# Patient Record
Sex: Male | Born: 1955 | Race: White | Hispanic: No | Marital: Married | Smoking: Never smoker
Health system: Southern US, Community
[De-identification: ages and names within clinical notes are randomized; demographics above are authoritative.]

## PROBLEM LIST (undated history)

## (undated) DIAGNOSIS — I10 Essential (primary) hypertension: Secondary | ICD-10-CM

## (undated) DIAGNOSIS — N400 Enlarged prostate without lower urinary tract symptoms: Secondary | ICD-10-CM

## (undated) DIAGNOSIS — Z87442 Personal history of urinary calculi: Secondary | ICD-10-CM

## (undated) DIAGNOSIS — K219 Gastro-esophageal reflux disease without esophagitis: Secondary | ICD-10-CM

## (undated) DIAGNOSIS — E119 Type 2 diabetes mellitus without complications: Secondary | ICD-10-CM

## (undated) HISTORY — PX: HERNIA REPAIR: SHX51

## (undated) HISTORY — PX: TONSILLECTOMY: SUR1361

## (undated) HISTORY — PX: CHOLECYSTECTOMY: SHX55

---

## 2006-04-12 ENCOUNTER — Ambulatory Visit: Payer: Self-pay | Admitting: Internal Medicine

## 2006-04-19 ENCOUNTER — Ambulatory Visit: Payer: Self-pay | Admitting: Urology

## 2006-04-22 ENCOUNTER — Ambulatory Visit: Payer: Self-pay | Admitting: Urology

## 2006-04-29 ENCOUNTER — Ambulatory Visit: Payer: Self-pay | Admitting: Urology

## 2006-05-31 ENCOUNTER — Ambulatory Visit: Payer: Self-pay | Admitting: Urology

## 2006-07-12 ENCOUNTER — Ambulatory Visit: Payer: Self-pay | Admitting: Surgery

## 2006-09-06 ENCOUNTER — Ambulatory Visit: Payer: Self-pay | Admitting: Urology

## 2013-01-02 ENCOUNTER — Ambulatory Visit: Payer: Self-pay | Admitting: Unknown Physician Specialty

## 2013-01-03 LAB — PATHOLOGY REPORT

## 2015-04-05 DIAGNOSIS — E7800 Pure hypercholesterolemia, unspecified: Secondary | ICD-10-CM | POA: Insufficient documentation

## 2016-02-11 DIAGNOSIS — Z7185 Encounter for immunization safety counseling: Secondary | ICD-10-CM | POA: Insufficient documentation

## 2016-02-11 DIAGNOSIS — Z7189 Other specified counseling: Secondary | ICD-10-CM | POA: Insufficient documentation

## 2017-04-12 DIAGNOSIS — E669 Obesity, unspecified: Secondary | ICD-10-CM | POA: Insufficient documentation

## 2017-04-12 DIAGNOSIS — E66811 Obesity, class 1: Secondary | ICD-10-CM | POA: Insufficient documentation

## 2018-09-19 ENCOUNTER — Observation Stay
Admission: EM | Admit: 2018-09-19 | Discharge: 2018-09-20 | Disposition: A | Discharge status: Home / Self Care | Payer: Managed Care, Other (non HMO) | Attending: Internal Medicine | Admitting: Internal Medicine

## 2018-09-19 ENCOUNTER — Encounter: Payer: Self-pay | Admitting: Emergency Medicine

## 2018-09-19 ENCOUNTER — Other Ambulatory Visit: Payer: Self-pay

## 2018-09-19 DIAGNOSIS — E11649 Type 2 diabetes mellitus with hypoglycemia without coma: Secondary | ICD-10-CM | POA: Diagnosis present

## 2018-09-19 DIAGNOSIS — Z9049 Acquired absence of other specified parts of digestive tract: Secondary | ICD-10-CM | POA: Insufficient documentation

## 2018-09-19 DIAGNOSIS — E160 Drug-induced hypoglycemia without coma: Secondary | ICD-10-CM

## 2018-09-19 DIAGNOSIS — Z794 Long term (current) use of insulin: Secondary | ICD-10-CM | POA: Diagnosis not present

## 2018-09-19 DIAGNOSIS — Z79899 Other long term (current) drug therapy: Secondary | ICD-10-CM | POA: Diagnosis not present

## 2018-09-19 DIAGNOSIS — N400 Enlarged prostate without lower urinary tract symptoms: Secondary | ICD-10-CM | POA: Diagnosis not present

## 2018-09-19 DIAGNOSIS — I10 Essential (primary) hypertension: Secondary | ICD-10-CM | POA: Diagnosis present

## 2018-09-19 DIAGNOSIS — T383X1A Poisoning by insulin and oral hypoglycemic [antidiabetic] drugs, accidental (unintentional), initial encounter: Secondary | ICD-10-CM | POA: Diagnosis not present

## 2018-09-19 DIAGNOSIS — Y69 Unspecified misadventure during surgical and medical care: Secondary | ICD-10-CM

## 2018-09-19 DIAGNOSIS — Z833 Family history of diabetes mellitus: Secondary | ICD-10-CM | POA: Insufficient documentation

## 2018-09-19 DIAGNOSIS — T383X5A Adverse effect of insulin and oral hypoglycemic [antidiabetic] drugs, initial encounter: Secondary | ICD-10-CM

## 2018-09-19 DIAGNOSIS — K219 Gastro-esophageal reflux disease without esophagitis: Secondary | ICD-10-CM | POA: Insufficient documentation

## 2018-09-19 DIAGNOSIS — E119 Type 2 diabetes mellitus without complications: Secondary | ICD-10-CM

## 2018-09-19 DIAGNOSIS — F17200 Nicotine dependence, unspecified, uncomplicated: Secondary | ICD-10-CM | POA: Insufficient documentation

## 2018-09-19 HISTORY — DX: Gastro-esophageal reflux disease without esophagitis: K21.9

## 2018-09-19 HISTORY — DX: Type 2 diabetes mellitus without complications: E11.9

## 2018-09-19 HISTORY — DX: Benign prostatic hyperplasia without lower urinary tract symptoms: N40.0

## 2018-09-19 NOTE — ED Triage Notes (Signed)
Patient ambulatory to triage with steady gait, without difficulty or distress noted; pt reports took 50u Humulog in error at 1025pm; st normally takes 8-10u lantus at bedtime and mixed them up; FSBS at home 180; EMS took just PTA and was 178; pt denies any c/o

## 2018-09-19 NOTE — ED Provider Notes (Signed)
[facility] Emergency Department Provider Note  ____________________________________________   First MD Initiated Contact with Patient 09/19/18 2330     (approximate)  I have reviewed the triage vital signs and the nursing notes.   HISTORY  Chief Complaint Drug Overdose   HPI Brandon [name] is a 62 y.o. male the patient comes to the emergency department after an inadvertent drug overdose.  He has a past medical history of diabetes mellitus and normally takes 50 units of Lantus at night along with Humalog throughout the day.  This evening he inadvertently administered 50 units of Humalog instead of 50 units of Lantus and subsequently began to feel lightheaded and nauseated.  He did not have much of an appetite thereafter and decided to come to the hospital out of concern that his blood sugar would bottom out.  His symptoms began suddenly were moderate severity are now slowly progressive.  He is feeling increasingly lightheaded and nauseated.  This was unintentional which has never happened before.  His symptoms are slowly worsening with time and nothing seems to be making them better in particular.    Past Medical History:  Diagnosis Date  . BPH (benign prostatic hyperplasia)   . Diabetes mellitus without complication (HCC)   . GERD (gastroesophageal reflux disease)     Patient Active Problem List   Diagnosis Date Noted  . Hypoglycemia due to insulin 09/20/2018  . Diabetes (HCC) 09/20/2018  . HTN (hypertension) 09/20/2018  . GERD (gastroesophageal reflux disease) 09/20/2018    Past Surgical History:  Procedure Laterality Date  . CHOLECYSTECTOMY    . HERNIA REPAIR    . TONSILLECTOMY      Prior to Admission medications   Medication Sig Start Date End Date Taking? Authorizing Provider  Ascorbic Acid (VITAMIN C) 1000 MG tablet Take 1,000 mg by mouth daily.   Yes [provider]  aspirin EC 81 MG tablet Take 81 mg by mouth daily.   Yes  [provider]  Insulin Glargine (LANTUS SOLOSTAR) 100 UNIT/ML Solostar Pen Inject 50 Units into the skin at bedtime. 06/14/18  Yes [provider]  insulin lispro (HUMALOG KWIKPEN) 100 UNIT/ML KwikPen Inject 8-10 Units into the skin 3 (three) times daily with meals. 06/17/18 06/17/19 Yes [provider]  lisinopril (PRINIVIL,ZESTRIL) 5 MG tablet Take 5 mg by mouth daily. 06/17/18  Yes [provider]  metFORMIN (GLUCOPHAGE) 500 MG tablet Take 500 mg by mouth 2 (two) times daily. 05/12/18  Yes [provider]  Multiple Vitamin (MULTI-VITAMINS) TABS Take 1 tablet by mouth daily.   Yes [provider]  omeprazole (PRILOSEC) 20 MG capsule Take 20 mg by mouth daily.   Yes [provider]  pravastatin (PRAVACHOL) 20 MG tablet Take 20 mg by mouth at bedtime. 05/12/18  Yes [provider]  zinc gluconate 50 MG tablet Take 50 mg by mouth daily.   Yes [provider]    Allergies Patient has no known allergies.  Family History  Problem Relation Age of Onset  . Diabetes Mother   . Diabetes Father     Social History Social History   Tobacco Use  . Smoking status: Current Every Day Smoker  . Smokeless tobacco: Never Used  Substance Use Topics  . Alcohol use: Not Currently  . Drug use: Not on file    Review of Systems Constitutional: No fever/chills Eyes: No visual changes. ENT: No sore throat. Cardiovascular: Denies chest pain. Respiratory: Denies shortness of breath. Gastrointestinal: No  abdominal pain.  Positive for nausea, no vomiting.  No diarrhea.  No constipation. Genitourinary: Negative for dysuria. Musculoskeletal: Negative for back pain. Skin: Negative for rash. Neurological: Negative for headaches, focal weakness or numbness.   ____________________________________________   PHYSICAL EXAM:  VITAL SIGNS: ED Triage Vitals [09/19/18 2328]  Enc Vitals Group     BP      Pulse      Resp      Temp        Temp src      SpO2      Weight 230 lb (104.3 kg)     Height 5\' 11"  (1.803 m)     Head Circumference      Peak Flow      Pain Score      Pain Loc      Pain Edu?      Excl. in GC?     Constitutional: Alert and oriented x4 appears somewhat uncomfortable though nontoxic no diaphoresis speaks in full clear sentences Eyes: PERRL EOMI. Head: Atraumatic. Nose: No congestion/rhinnorhea. Mouth/Throat: No trismus Neck: No stridor.   Cardiovascular: Normal rate, regular rhythm. Grossly normal heart sounds.  Good peripheral circulation. Respiratory: Normal respiratory effort.  No retractions. Lungs CTAB and moving good air Gastrointestinal: Soft nontender Musculoskeletal: No lower extremity edema   Neurologic:  Normal speech and language. No gross focal neurologic deficits are appreciated. Skin:  Skin is warm, dry and intact. No rash noted. Psychiatric: Mood and affect are normal. Speech and behavior are normal.    ____________________________________________   DIFFERENTIAL includes but not limited to  Therapeutic misadventure, intentional drug overdose, hypoglycemia, acute kidney injury ____________________________________________   LABS (all labs ordered are listed, but only abnormal results are displayed)  Labs Reviewed  COMPREHENSIVE METABOLIC PANEL - Abnormal; Notable for the following components:      Result Value   Glucose, Bld 107 (*)    BUN 24 (*)    Calcium 8.6 (*)    All other components within normal limits  CBC WITH DIFFERENTIAL/PLATELET - Abnormal; Notable for the following components:   RBC 4.09 (*)    Hemoglobin 12.8 (*)    HCT 37.3 (*)    All other components within normal limits  GLUCOSE, CAPILLARY - Abnormal; Notable for the following components:   Glucose-Capillary 114 (*)    All other components within normal limits  GLUCOSE, CAPILLARY - Abnormal; Notable for the following components:   Glucose-Capillary 101 (*)    All other components within normal  limits  BASIC METABOLIC PANEL - Abnormal; Notable for the following components:   Glucose, Bld 201 (*)    Calcium 8.1 (*)    All other components within normal limits  CBC - Abnormal; Notable for the following components:   RBC 3.74 (*)    Hemoglobin 11.7 (*)    HCT 34.2 (*)    All other components within normal limits  GLUCOSE, CAPILLARY - Abnormal; Notable for the following components:   Glucose-Capillary 265 (*)    All other components within normal limits  GLUCOSE, CAPILLARY - Abnormal; Notable for the following components:   Glucose-Capillary 177 (*)    All other components within normal limits  GLUCOSE, CAPILLARY  GLUCOSE, CAPILLARY  HIV ANTIBODY (ROUTINE TESTING W REFLEX)    Lab work reviewed by me shows blood sugars that were initially dropping rapidly.  Following treatment they have come up nicely his renal function is at baseline __________________________________________  EKG   ____________________________________________  RADIOLOGY  ____________________________________________   PROCEDURES  Procedure(s) performed: no  .Critical Care Performed by: Merrily Brittle, MD Authorized by: Merrily Brittle, MD   Critical care provider statement:    Critical care time (minutes):  30   Critical care time was exclusive of:  Separately billable procedures and treating other patients   Critical care was necessary to treat or prevent imminent or life-threatening deterioration of the following conditions:  Endocrine crisis and toxidrome   Critical care was time spent personally by me on the following activities:  Development of treatment plan with patient or surrogate, discussions with consultants, evaluation of patient's response to treatment, examination of patient, obtaining history from patient or surrogate, ordering and performing treatments and interventions, ordering and review of laboratory studies, ordering and review of radiographic studies, pulse oximetry,  re-evaluation of patient's condition and review of old charts    Critical Care performed: Yes  ____________________________________________   INITIAL IMPRESSION / ASSESSMENT AND PLAN / ED COURSE  Pertinent labs & imaging results that were available during my care of the patient were reviewed by me and considered in my medical decision making (see chart for details).   As part of my medical decision making, I reviewed the following data within the electronic MEDICAL RECORD NUMBER History obtained from family if available, nursing notes, old chart and ekg, as well as notes from prior ED visits.  The patient comes to the emergency department after an inadvertent overdose of Humalog.  50 units is extremely high and he is at high risk for bottoming out and becoming more symptomatic.  His initial blood sugars here were 176 then 114 then 101.  I then placed the patient on a D10 infusion and gave him 1 mg of glucagon intravenously.  The glucagon did seem to make him somewhat nauseated at first so I then gave him IV ondansetron.  Given the high dose of insulin and the fact the patient did become symptomatic he does require inpatient admission for acute 2-hour glucose monitoring and continued D10 infusion.  The patient and his wife verbalized understanding and agreement with the plan.  I then discussed with the hospitalist Dr. Anne Hahn who has graciously agreed to admit the patient to his service.      ____________________________________________   FINAL CLINICAL IMPRESSION(S) / ED DIAGNOSES  Final diagnoses:  Therapeutic misadventure  Insulin overdose, accidental or unintentional, initial encounter      NEW MEDICATIONS STARTED DURING THIS VISIT:  Current Discharge Medication List       Note:  This document was prepared using Dragon voice recognition software and may include unintentional dictation errors.    Merrily Brittle, MD 09/20/18 [phone]

## 2018-09-20 ENCOUNTER — Encounter: Payer: Self-pay | Admitting: Internal Medicine

## 2018-09-20 ENCOUNTER — Other Ambulatory Visit: Payer: Self-pay

## 2018-09-20 DIAGNOSIS — K219 Gastro-esophageal reflux disease without esophagitis: Secondary | ICD-10-CM | POA: Diagnosis present

## 2018-09-20 DIAGNOSIS — E119 Type 2 diabetes mellitus without complications: Secondary | ICD-10-CM

## 2018-09-20 DIAGNOSIS — I10 Essential (primary) hypertension: Secondary | ICD-10-CM | POA: Diagnosis present

## 2018-09-20 DIAGNOSIS — T383X5A Adverse effect of insulin and oral hypoglycemic [antidiabetic] drugs, initial encounter: Secondary | ICD-10-CM

## 2018-09-20 DIAGNOSIS — E160 Drug-induced hypoglycemia without coma: Secondary | ICD-10-CM | POA: Diagnosis present

## 2018-09-20 LAB — COMPREHENSIVE METABOLIC PANEL
ALK PHOS: 38 U/L (ref 38–126)
ALT: 31 U/L (ref 0–44)
AST: 39 U/L (ref 15–41)
Albumin: 4.3 g/dL (ref 3.5–5.0)
Anion gap: 7 (ref 5–15)
BILIRUBIN TOTAL: 1.1 mg/dL (ref 0.3–1.2)
BUN: 24 mg/dL — ABNORMAL HIGH (ref 8–23)
CO2: 24 mmol/L (ref 22–32)
Calcium: 8.6 mg/dL — ABNORMAL LOW (ref 8.9–10.3)
Chloride: 108 mmol/L (ref 98–111)
Creatinine, Ser: 0.73 mg/dL (ref 0.61–1.24)
GFR calc Af Amer: >60 mL/min (ref >60)
GFR calc non Af Amer: >60 mL/min (ref >60)
GLUCOSE: 107 mg/dL — AB (ref 70–99)
Potassium: 3.6 mmol/L (ref 3.5–5.1)
Sodium: 139 mmol/L (ref 135–145)
TOTAL PROTEIN: 6.7 g/dL (ref 6.5–8.1)

## 2018-09-20 LAB — CBC WITH DIFFERENTIAL/PLATELET
ABS IMMATURE GRANULOCYTES: 0.02 10*3/uL (ref 0.00–0.07)
Basophils Absolute: 0 10*3/uL (ref 0.0–0.1)
Basophils Relative: 1 %
Eosinophils Absolute: 0.1 10*3/uL (ref 0.0–0.5)
Eosinophils Relative: 2 %
HCT: 37.3 % — ABNORMAL LOW (ref 39.0–52.0)
Hemoglobin: 12.8 g/dL — ABNORMAL LOW (ref 13.0–17.0)
Immature Granulocytes: 0 %
Lymphocytes Relative: 31 %
Lymphs Abs: 1.6 10*3/uL (ref 0.7–4.0)
MCH: 31.3 pg (ref 26.0–34.0)
MCHC: 34.3 g/dL (ref 30.0–36.0)
MCV: 91.2 fL (ref 80.0–100.0)
Monocytes Absolute: 0.4 10*3/uL (ref 0.1–1.0)
Monocytes Relative: 8 %
NEUTROS ABS: 2.9 10*3/uL (ref 1.7–7.7)
NEUTROS PCT: 58 %
Platelets: 184 10*3/uL (ref 150–400)
RBC: 4.09 MIL/uL — ABNORMAL LOW (ref 4.22–5.81)
RDW: 12.9 % (ref 11.5–15.5)
WBC: 5.1 10*3/uL (ref 4.0–10.5)
nRBC: 0 % (ref 0.0–0.2)

## 2018-09-20 LAB — BASIC METABOLIC PANEL
ANION GAP: 6 (ref 5–15)
BUN: 23 mg/dL (ref 8–23)
CALCIUM: 8.1 mg/dL — AB (ref 8.9–10.3)
CO2: 24 mmol/L (ref 22–32)
Chloride: 106 mmol/L (ref 98–111)
Creatinine, Ser: 0.72 mg/dL (ref 0.61–1.24)
GFR calc Af Amer: >60 mL/min (ref >60)
GFR calc non Af Amer: >60 mL/min (ref >60)
Glucose, Bld: 201 mg/dL — ABNORMAL HIGH (ref 70–99)
Potassium: 3.8 mmol/L (ref 3.5–5.1)
Sodium: 136 mmol/L (ref 135–145)

## 2018-09-20 LAB — CBC
HCT: 34.2 % — ABNORMAL LOW (ref 39.0–52.0)
Hemoglobin: 11.7 g/dL — ABNORMAL LOW (ref 13.0–17.0)
MCH: 31.3 pg (ref 26.0–34.0)
MCHC: 34.2 g/dL (ref 30.0–36.0)
MCV: 91.4 fL (ref 80.0–100.0)
PLATELETS: 170 10*3/uL (ref 150–400)
RBC: 3.74 MIL/uL — ABNORMAL LOW (ref 4.22–5.81)
RDW: 12.9 % (ref 11.5–15.5)
WBC: 5.1 10*3/uL (ref 4.0–10.5)
nRBC: 0 % (ref 0.0–0.2)

## 2018-09-20 LAB — GLUCOSE, CAPILLARY
GLUCOSE-CAPILLARY: 233 mg/dL — AB (ref 70–99)
Glucose-Capillary: 101 mg/dL — ABNORMAL HIGH (ref 70–99)
Glucose-Capillary: 114 mg/dL — ABNORMAL HIGH (ref 70–99)
Glucose-Capillary: 177 mg/dL — ABNORMAL HIGH (ref 70–99)
Glucose-Capillary: 196 mg/dL — ABNORMAL HIGH (ref 70–99)
Glucose-Capillary: 254 mg/dL — ABNORMAL HIGH (ref 70–99)
Glucose-Capillary: 265 mg/dL — ABNORMAL HIGH (ref 70–99)
Glucose-Capillary: 72 mg/dL (ref 70–99)
Glucose-Capillary: 96 mg/dL (ref 70–99)

## 2018-09-20 MED ORDER — GLUCAGON HCL RDNA (DIAGNOSTIC) 1 MG IJ SOLR
1.0000 mg | Freq: Once | INTRAMUSCULAR | Status: AC
  Administered 2018-09-20: 1 mg via INTRAVENOUS
  Filled 2018-09-20: qty 1

## 2018-09-20 MED ORDER — ONDANSETRON HCL 4 MG PO TABS: 4.0000 mg | ORAL_TABLET | Freq: Four times a day (QID) | ORAL | Status: DC | PRN

## 2018-09-20 MED ORDER — ONDANSETRON HCL 4 MG/2ML IJ SOLN
4.0000 mg | Freq: Once | INTRAMUSCULAR | Status: DC
  Filled 2018-09-20: qty 2

## 2018-09-20 MED ORDER — ONDANSETRON HCL 4 MG/2ML IJ SOLN: 4.0000 mg | Freq: Once | INTRAMUSCULAR | Status: DC

## 2018-09-20 MED ORDER — ONDANSETRON HCL 4 MG/2ML IJ SOLN: 4.0000 mg | Freq: Four times a day (QID) | INTRAMUSCULAR | Status: DC | PRN

## 2018-09-20 MED ORDER — DEXTROSE 10 % IV SOLN
INTRAVENOUS | Status: DC
  Administered 2018-09-20: 01:00:00 via INTRAVENOUS

## 2018-09-20 MED ORDER — ACETAMINOPHEN 325 MG PO TABS: 650.0000 mg | ORAL_TABLET | Freq: Four times a day (QID) | ORAL | Status: DC | PRN

## 2018-09-20 MED ORDER — ENOXAPARIN SODIUM 40 MG/0.4ML ~~LOC~~ SOLN
40.0000 mg | SUBCUTANEOUS | Status: DC
  Filled 2018-09-20: qty 0.4

## 2018-09-20 MED ORDER — ACETAMINOPHEN 650 MG RE SUPP: 650.0000 mg | Freq: Four times a day (QID) | RECTAL | Status: DC | PRN

## 2018-09-20 NOTE — ED Notes (Signed)
ED TO INPATIENT HANDOFF REPORT  Name/Age/Gender Brandon [name] 62 y.o. male  Code Status   Home/SNF/Other home  Chief Complaint Took wrong medication  Level of Care/Admitting Diagnosis ED Disposition    ED Disposition Condition Comment   Admit  Hospital Area: [facility] [[id number]]  Level of Care: Med-Surg [16]  Diagnosis: Hypoglycemia due to insulin [161096]  Admitting Physician: Oralia Manis [[id number]]  Attending Physician: Oralia Manis [phone]  PT Class (Do Not Modify): Observation [104]  PT Acc Code (Do Not Modify): Observation [10022]       Medical History Past Medical History:  Diagnosis Date  . BPH (benign prostatic hyperplasia)   . Diabetes mellitus without complication (HCC)   . GERD (gastroesophageal reflux disease)     Allergies No Known Allergies  IV Location/Drains/Wounds Patient Lines/Drains/Airways Status   Active Line/Drains/Airways    Name:   Placement date:   Placement time:   Site:   Days:   Peripheral IV 09/20/18 Right Forearm   09/20/18    0000    Forearm   less than 1          Labs/Imaging Results for orders placed or performed during the hospital encounter of 09/19/18 (from the past 48 hour(s))  Comprehensive metabolic panel     Status: Abnormal   Collection Time: 09/19/18 11:43 PM  Result Value Ref Range   Sodium 139 135 - 145 mmol/L   Potassium 3.6 3.5 - 5.1 mmol/L   Chloride 108 98 - 111 mmol/L   CO2 24 22 - 32 mmol/L   Glucose, Bld 107 (H) 70 - 99 mg/dL   BUN 24 (H) 8 - 23 mg/dL   Creatinine, Ser 2.95 0.61 - 1.24 mg/dL   Calcium 8.6 (L) 8.9 - 10.3 mg/dL   Total Protein 6.7 6.5 - 8.1 g/dL   Albumin 4.3 3.5 - 5.0 g/dL   AST 39 15 - 41 U/L   ALT 31 0 - 44 U/L   Alkaline Phosphatase 38 38 - 126 U/L   Total Bilirubin 1.1 0.3 - 1.2 mg/dL   GFR calc non Af Amer >60 >60 mL/min   GFR calc Af Amer >60 >60 mL/min   Anion gap 7 5 - 15    Comment: Performed at [facility], [address]  [place], [place] [address]  CBC with Differential     Status: Abnormal   Collection Time: 09/19/18 11:43 PM  Result Value Ref Range   WBC 5.1 4.0 - 10.5 K/uL   RBC 4.09 (L) 4.22 - 5.81 MIL/uL   Hemoglobin 12.8 (L) 13.0 - 17.0 g/dL   HCT 86.5 (L) 78.4 - 69.6 %   MCV 91.2 80.0 - 100.0 fL   MCH 31.3 26.0 - 34.0 pg   MCHC 34.3 30.0 - 36.0 g/dL   RDW 29.5 28.4 - 13.2 %   Platelets 184 150 - 400 K/uL   nRBC 0.0 0.0 - 0.2 %   Neutrophils Relative % 58 %   Neutro Abs 2.9 1.7 - 7.7 K/uL   Lymphocytes Relative 31 %   Lymphs Abs 1.6 0.7 - 4.0 K/uL   Monocytes Relative 8 %   Monocytes Absolute 0.4 0.1 - 1.0 K/uL   Eosinophils Relative 2 %   Eosinophils Absolute 0.1 0.0 - 0.5 K/uL   Basophils Relative 1 %   Basophils Absolute 0.0 0.0 - 0.1 K/uL   Immature Granulocytes 0 %   Abs Immature Granulocytes 0.02 0.00 - 0.07 K/uL  Comment: Performed at [facility], [address] [place], [place] [address]  Glucose, capillary     Status: None   Collection Time: 09/20/18 12:26 AM  Result Value Ref Range   Glucose-Capillary 96 70 - 99 mg/dL  Glucose, capillary     Status: Abnormal   Collection Time: 09/20/18 12:43 AM  Result Value Ref Range   Glucose-Capillary 114 (H) 70 - 99 mg/dL  Glucose, capillary     Status: Abnormal   Collection Time: 09/20/18  1:28 AM  Result Value Ref Range   Glucose-Capillary 101 (H) 70 - 99 mg/dL   No results found.  Pending Labs [employer] Labs (From admission, onward)    Start     Ordered   Signed and Held  HIV antibody (Routine Testing)  Once,   R     Signed and Held   Signed and Held  CBC  (enoxaparin (LOVENOX)    CrCl >/= 30 ml/min)  Once,   R    Comments:  Baseline for enoxaparin therapy IF NOT ALREADY DRAWN.  Notify MD if PLT < 100 K.    Signed and Held   Signed and Held  Creatinine, serum  (enoxaparin (LOVENOX)    CrCl >/= 30 ml/min)  Once,   R    Comments:  Baseline for enoxaparin therapy IF NOT ALREADY DRAWN.    Signed and Held   Signed and  Held  Creatinine, serum  (enoxaparin (LOVENOX)    CrCl >/= 30 ml/min)  Weekly,   R    Comments:  while on enoxaparin therapy    Signed and Held   Signed and Held  Basic metabolic panel  Tomorrow morning,   R     Signed and Held   Signed and Held  CBC  Tomorrow morning,   R     Signed and Held          Vitals/Pain Today's Vitals   09/19/18 2330 09/19/18 2335 09/20/18 0000 09/20/18 0030  BP:  (!) 142/98 137/75 126/79  Pulse:  77 82 78  Resp:  18 (!) 25 (!) 21  Temp:  97.6 F (36.4 C)    TempSrc:  Oral    SpO2:  97% 97% 96%  Weight:      Height:      PainSc: 0-No pain       Isolation Precautions No active isolations  Medications Medications  dextrose 10 % infusion ( Intravenous New Bag/Given 09/20/18 0031)  ondansetron (ZOFRAN) injection 4 mg (has no administration in time range)  ondansetron (ZOFRAN) injection 4 mg (has no administration in time range)  glucagon (human recombinant) (GLUCAGEN) injection 1 mg (1 mg Intravenous Given 09/20/18 0036)    Mobility independent

## 2018-09-20 NOTE — H&P (Signed)
[facility] [place] [facility]   PATIENT NAME: Brandon [name]    MR#:  [id number]  DATE OF BIRTH:  03/31/1956  DATE OF ADMISSION:  09/19/2018  PRIMARY CARE PHYSICIAN: Marisue IvanLinthavong, Kanhka, MD   REQUESTING/REFERRING PHYSICIAN: Lamont Snowballifenbark, MD  CHIEF COMPLAINT:   Chief Complaint  Patient presents with  . Drug Overdose    HISTORY OF PRESENT ILLNESS:  Brandon Droneoby Sans  is a 62 y.o. male who presents with chief complaint as above.  Patient presents to the ED after accidentally taking short acting insulin instead of his Lantus tonight.  He normally takes 50 units of Lantus at bedtime, and instead mistakenly took 50 units of short acting Humalog.  When EMS arrived at his house his glucose was low and they recommended he come here for observation.  Here he was started on D10 infusion, and his glucose has remained level over the last 2 hours.  Hospitalist were called for admission and further monitoring  PAST MEDICAL HISTORY:   Past Medical History:  Diagnosis Date  . BPH (benign prostatic hyperplasia)   . Diabetes mellitus without complication (HCC)   . GERD (gastroesophageal reflux disease)      PAST SURGICAL HISTORY:   Past Surgical History:  Procedure Laterality Date  . CHOLECYSTECTOMY    . HERNIA REPAIR    . TONSILLECTOMY       SOCIAL HISTORY:   Social History   Tobacco Use  . Smoking status: Current Every Day Smoker  . Smokeless tobacco: Never Used  Substance Use Topics  . Alcohol use: Not Currently     FAMILY HISTORY:   Family History  Problem Relation Age of Onset  . Diabetes Mother   . Diabetes Father      DRUG ALLERGIES:  No Known Allergies  MEDICATIONS AT HOME:   Prior to Admission medications   Not on File    REVIEW OF SYSTEMS:  Review of Systems  Constitutional: Negative for chills, fever, malaise/fatigue and weight loss.  HENT: Negative for ear pain, hearing loss and tinnitus.   Eyes: Negative for blurred vision,  double vision, pain and redness.  Respiratory: Negative for cough, hemoptysis and shortness of breath.   Cardiovascular: Negative for chest pain, palpitations, orthopnea and leg swelling.  Gastrointestinal: Negative for abdominal pain, constipation, diarrhea, nausea and vomiting.  Genitourinary: Negative for dysuria, frequency and hematuria.  Musculoskeletal: Negative for back pain, joint pain and neck pain.  Skin:       No acne, rash, or lesions  Neurological: Negative for dizziness, tremors, focal weakness and weakness.  Endo/Heme/Allergies: Negative for polydipsia. Does not bruise/bleed easily.  Psychiatric/Behavioral: Negative for depression. The patient is not nervous/anxious and does not have insomnia.      VITAL SIGNS:   Vitals:   09/19/18 2328 09/19/18 2335 09/20/18 0000 09/20/18 0030  BP:  (!) 142/98 137/75 126/79  Pulse:  77 82 78  Resp:  18 (!) 25 (!) 21  Temp:  97.6 F (36.4 C)    TempSrc:  Oral    SpO2:  97% 97% 96%  Weight: 104.3 kg     Height: 5\' 11"  (1.803 m)      Wt Readings from Last 3 Encounters:  09/19/18 104.3 kg    PHYSICAL EXAMINATION:  Physical Exam  Vitals reviewed. Constitutional: He is oriented to person, place, and time. He appears well-developed and well-nourished. No distress.  HENT:  Head: Normocephalic and atraumatic.  Mouth/Throat: Oropharynx is clear and moist.  Eyes: Pupils are  equal, round, and reactive to light. Conjunctivae and EOM are normal. No scleral icterus.  Neck: Normal range of motion. Neck supple. No JVD present. No thyromegaly present.  Cardiovascular: Normal rate, regular rhythm and intact distal pulses. Exam reveals no gallop and no friction rub.  No murmur heard. Respiratory: Effort normal and breath sounds normal. No respiratory distress. He has no wheezes. He has no rales.  GI: Soft. Bowel sounds are normal. He exhibits no distension. There is no abdominal tenderness.  Musculoskeletal: Normal range of motion.         General: No edema.     Comments: No arthritis, no gout  Lymphadenopathy:    He has no cervical adenopathy.  Neurological: He is alert and oriented to person, place, and time. No cranial nerve deficit.  No dysarthria, no aphasia  Skin: Skin is warm and dry. No rash noted. No erythema.  Psychiatric: He has a normal mood and affect. His behavior is normal. Judgment and thought content normal.    LABORATORY PANEL:   CBC Recent Labs  Lab 09/19/18 2343  WBC 5.1  HGB 12.8*  HCT 37.3*  PLT 184   ------------------------------------------------------------------------------------------------------------------  Chemistries  Recent Labs  Lab 09/19/18 2343  NA 139  K 3.6  CL 108  CO2 24  GLUCOSE 107*  BUN 24*  CREATININE 0.73  CALCIUM 8.6*  AST 39  ALT 31  ALKPHOS 38  BILITOT 1.1   ------------------------------------------------------------------------------------------------------------------  Cardiac Enzymes No results for input(s): TROPONINI in the last 168 hours. ------------------------------------------------------------------------------------------------------------------  RADIOLOGY:  No results found.  EKG:  No orders found for this or any previous visit.  IMPRESSION AND PLAN:  Principal Problem:   Hypoglycemia due to insulin -continue D10 infusion for now, wean this as his glucose starts to rise which I expect will occur over the next few hours given Humalog half-life of around an hour. Active Problems:   Diabetes (HCC) -frequent fingerstick monitoring on D10 infusion as above, wean D10 infusion as his glucose rises as above.   HTN (hypertension) -home dose antihypertensives   GERD (gastroesophageal reflux disease) -home dose PPI  Chart review performed and case discussed with ED provider. Labs, imaging and/or ECG reviewed by provider and discussed with patient/family. Management plans discussed with the patient and/or family.  DVT PROPHYLAXIS: SubQ  lovenox   GI PROPHYLAXIS:  PPI   ADMISSION STATUS: Observation  CODE STATUS: Full  TOTAL TIME TAKING CARE OF THIS PATIENT: 40 minutes.   June Rode FIELDING 09/20/2018, 1:15 AM  [facility]  Office  [phone]  CC: Primary care physician; Marisue Ivan, MD  Note:  This document was prepared using Dragon voice recognition software and may include unintentional dictation errors.

## 2018-09-20 NOTE — ED Notes (Signed)
ED Provider at bedside. 

## 2018-09-20 NOTE — Discharge Summary (Signed)
[facility] - [place] at [facility]  Brandon [name], [place]62 y.o., DOB 11/26/1955, MRN [id number]. Admission date: 09/19/2018 Discharge Date 09/20/2018 Primary MD Brandon IvanLinthavong, Kanh[place], MD Admitting Physician Brandon Manisavid Willis, MD  Admission Diagnosis  Therapeutic misadventure [Y69] Insulin overdose, accidental or unintentional, initial encounter [T38.3X1A]  Discharge Diagnosis   Principal Problem:   Hypoglycemia due accidental insulin ingestion   Diabetes (HCC)   HTN (hypertension)   GERD (gastroesophageal reflux disease)         Hospital Course   Brandon [name]  is a 62 y.o. male who presents with chief complaint as above.  Patient presents to the ED after accidentally taking short acting insulin instead of his Lantus tonight.  He normally takes 50 units of Lantus at bedtime, and instead mistakenly took 50 units of short acting Humalog.  When EMS arrived at his house his glucose was low and they recommended he come here for observation.  Patient's blood sugars have now stabilized.  He is currently asymptomatic.  I recommended patient only take half of his Lantus dose today starting tomorrow he can resume his previous regimen if his blood sugars are stable.            Consults  None  Significant Tests:  See full reports for all details     No results found.     Today   Subjective:   [name] patient doing well denies any complaint o Objective:   Blood pressure 126/61, pulse 73, temperature 98.5 F (36.9 C), temperature source Oral, resp. rate 18, height 5\' 11"  (1.803 m), weight 104.3 kg, SpO2 97 %.  .  Intake/Output Summary (Last 24 hours) at 09/20/2018 1256 Last data filed at 09/20/2018 1107 Gross per 24 hour  Intake 891.09 ml  Output -  Net 891.09 ml    Exam VITAL SIGNS: Blood pressure 126/61, pulse 73, temperature 98.5 F (36.9 C), temperature source Oral, resp. rate 18, height 5\' 11"  (1.803 m), weight 104.3 kg, SpO2 97 %.  GENERAL:  62  y.o.-year-old patient lying in the bed with no acute distress.  EYES: Pupils equal, round, reactive to light and accommodation. No scleral icterus. Extraocular muscles intact.  HEENT: Head atraumatic, normocephalic. Oropharynx and nasopharynx clear.  NECK:  Supple, no jugular venous distention. No thyroid enlargement, no tenderness.  LUNGS: Normal breath sounds bilaterally, no wheezing, rales,rhonchi or crepitation. No use of accessory muscles of respiration.  CARDIOVASCULAR: S1, S2 normal. No murmurs, rubs, or gallops.  ABDOMEN: Soft, nontender, nondistended. Bowel sounds present. No organomegaly or mass.  EXTREMITIES: No pedal edema, cyanosis, or clubbing.  NEUROLOGIC: Cranial nerves II through XII are intact. Muscle strength 5/5 in all extremities. Sensation intact. Gait not checked.  PSYCHIATRIC: The patient is alert and oriented x 3.  SKIN: No obvious rash, lesion, or ulcer.   Data Review     CBC w Diff:  Lab Results  Component Value Date   WBC 5.1 09/20/2018   HGB 11.7 (L) 09/20/2018   HCT 34.2 (L) 09/20/2018   PLT 170 09/20/2018   LYMPHOPCT 31 09/19/2018   MONOPCT 8 09/19/2018   EOSPCT 2 09/19/2018   BASOPCT 1 09/19/2018   CMP:  Lab Results  Component Value Date   NA 136 09/20/2018   K 3.8 09/20/2018   CL 106 09/20/2018   CO2 24 09/20/2018   BUN 23 09/20/2018   CREATININE 0.72 09/20/2018   PROT 6.7 09/19/2018   ALBUMIN 4.3 09/19/2018   BILITOT 1.1 09/19/2018   ALKPHOS  38 09/19/2018   AST 39 09/19/2018   ALT 31 09/19/2018  .  Micro Results No results found for this or any previous visit (from the past 240 hour(s)).      Code Status Orders  (From admission, onward)         Start     Ordered   09/20/18 0212  Full code  Continuous     09/20/18 0211        Code Status History    This patient has a current code status but no historical code status.          Follow-up Information    Brandon Ivan, MD On 09/26/2018.   Specialty:  Family  Medicine Why:  at [address] information: [address] [place] [address] [phone]           Discharge Medications   Allergies as of 09/20/2018   No Known Allergies     Medication List    TAKE these medications   aspirin EC 81 MG tablet Take 81 mg by mouth daily.   HUMALOG KWIKPEN 100 UNIT/ML KwikPen Generic drug:  insulin lispro Inject 8-10 Units into the skin 3 (three) times daily with meals.   LANTUS SOLOSTAR 100 UNIT/ML Solostar Pen Generic drug:  Insulin Glargine Inject 50 Units into the skin at bedtime.   lisinopril 5 MG tablet Commonly known as:  PRINIVIL,ZESTRIL Take 5 mg by mouth daily.   metFORMIN 500 MG tablet Commonly known as:  GLUCOPHAGE Take 500 mg by mouth 2 (two) times daily.   MULTI-VITAMINS Tabs Take 1 tablet by mouth daily.   omeprazole 20 MG capsule Commonly known as:  PRILOSEC Take 20 mg by mouth daily.   pravastatin 20 MG tablet Commonly known as:  PRAVACHOL Take 20 mg by mouth at bedtime.   vitamin C 1000 MG tablet Take 1,000 mg by mouth daily.   zinc gluconate 50 MG tablet Take 50 mg by mouth daily.          Total Time in preparing paper work, data evaluation and todays exam - 35 minutes  Auburn Bilberry M.D on 09/20/2018 at 12:56 PM [facility]   Office  [phone]

## 2018-09-20 NOTE — Progress Notes (Addendum)
Pt ready for discharge home today per MD as CBG stayed >100. Patient assessment unchanged from this morning. Reviewed discharge instructions (including taking half of lantus dose) with pt and his wife; all questions answered and pt verbalized understanding. PIV removed, VSS. Pt assisted to car via NT.   Brandon AmmonsKorie Kweli Grassel

## 2018-09-21 LAB — HIV ANTIBODY (ROUTINE TESTING W REFLEX): HIV Screen 4th Generation wRfx: NONREACTIVE

## 2019-04-18 ENCOUNTER — Other Ambulatory Visit: Payer: Self-pay

## 2019-04-18 ENCOUNTER — Other Ambulatory Visit
Admission: RE | Admit: 2019-04-18 | Discharge: 2019-04-18 | Disposition: A | Discharge status: Home / Self Care | Payer: Managed Care, Other (non HMO) | Source: Ambulatory Visit | Attending: Unknown Physician Specialty | Admitting: Unknown Physician Specialty

## 2019-04-18 DIAGNOSIS — Z1159 Encounter for screening for other viral diseases: Secondary | ICD-10-CM | POA: Diagnosis present

## 2019-04-18 LAB — SARS CORONAVIRUS 2 (TAT 6-24 HRS): SARS Coronavirus 2: NEGATIVE

## 2019-04-21 ENCOUNTER — Encounter
Admission: RE | Disposition: A | Discharge status: Home / Self Care | Payer: Self-pay | Source: Home / Self Care | Attending: Internal Medicine

## 2019-04-21 ENCOUNTER — Ambulatory Visit: Payer: Managed Care, Other (non HMO) | Admitting: Anesthesiology

## 2019-04-21 ENCOUNTER — Other Ambulatory Visit: Payer: Self-pay

## 2019-04-21 ENCOUNTER — Ambulatory Visit
Admission: RE | Admit: 2019-04-21 | Discharge: 2019-04-21 | Disposition: A | Discharge status: Home / Self Care | Payer: Managed Care, Other (non HMO) | Attending: Internal Medicine | Admitting: Internal Medicine

## 2019-04-21 DIAGNOSIS — Z1211 Encounter for screening for malignant neoplasm of colon: Secondary | ICD-10-CM | POA: Diagnosis present

## 2019-04-21 DIAGNOSIS — Z7984 Long term (current) use of oral hypoglycemic drugs: Secondary | ICD-10-CM | POA: Insufficient documentation

## 2019-04-21 DIAGNOSIS — Z794 Long term (current) use of insulin: Secondary | ICD-10-CM | POA: Insufficient documentation

## 2019-04-21 DIAGNOSIS — Z800 Family history of malignant neoplasm of digestive organs: Secondary | ICD-10-CM | POA: Diagnosis not present

## 2019-04-21 DIAGNOSIS — N400 Enlarged prostate without lower urinary tract symptoms: Secondary | ICD-10-CM | POA: Insufficient documentation

## 2019-04-21 DIAGNOSIS — Z7982 Long term (current) use of aspirin: Secondary | ICD-10-CM | POA: Diagnosis not present

## 2019-04-21 DIAGNOSIS — Z9049 Acquired absence of other specified parts of digestive tract: Secondary | ICD-10-CM | POA: Insufficient documentation

## 2019-04-21 DIAGNOSIS — K5730 Diverticulosis of large intestine without perforation or abscess without bleeding: Secondary | ICD-10-CM | POA: Diagnosis not present

## 2019-04-21 DIAGNOSIS — I10 Essential (primary) hypertension: Secondary | ICD-10-CM | POA: Insufficient documentation

## 2019-04-21 DIAGNOSIS — E119 Type 2 diabetes mellitus without complications: Secondary | ICD-10-CM | POA: Insufficient documentation

## 2019-04-21 DIAGNOSIS — Z79899 Other long term (current) drug therapy: Secondary | ICD-10-CM | POA: Diagnosis not present

## 2019-04-21 DIAGNOSIS — K219 Gastro-esophageal reflux disease without esophagitis: Secondary | ICD-10-CM | POA: Diagnosis not present

## 2019-04-21 HISTORY — PX: COLONOSCOPY WITH PROPOFOL: SHX5780

## 2019-04-21 LAB — GLUCOSE, CAPILLARY: Glucose-Capillary: 173 mg/dL — ABNORMAL HIGH (ref 70–99)

## 2019-04-21 SURGERY — COLONOSCOPY WITH PROPOFOL
Anesthesia: General

## 2019-04-21 MED ORDER — LIDOCAINE HCL (PF) 2 % IJ SOLN
INTRAMUSCULAR | Status: DC | PRN
  Administered 2019-04-21: 80 mg

## 2019-04-21 MED ORDER — FENTANYL CITRATE (PF) 100 MCG/2ML IJ SOLN
INTRAMUSCULAR | Status: AC
  Filled 2019-04-21: qty 2

## 2019-04-21 MED ORDER — MIDAZOLAM HCL 2 MG/2ML IJ SOLN
INTRAMUSCULAR | Status: AC
  Filled 2019-04-21: qty 2

## 2019-04-21 MED ORDER — SODIUM CHLORIDE 0.9 % IV SOLN
INTRAVENOUS | Status: DC
  Administered 2019-04-21: 13:00:00 via INTRAVENOUS

## 2019-04-21 MED ORDER — MIDAZOLAM HCL 5 MG/5ML IJ SOLN
INTRAMUSCULAR | Status: DC | PRN
  Administered 2019-04-21: 2 mg via INTRAVENOUS

## 2019-04-21 MED ORDER — FENTANYL CITRATE (PF) 100 MCG/2ML IJ SOLN
INTRAMUSCULAR | Status: DC | PRN
  Administered 2019-04-21 (×2): 50 ug via INTRAVENOUS

## 2019-04-21 MED ORDER — PROPOFOL 500 MG/50ML IV EMUL
INTRAVENOUS | Status: DC | PRN
  Administered 2019-04-21: 50 ug/kg/min via INTRAVENOUS

## 2019-04-21 MED ORDER — LIDOCAINE HCL (PF) 2 % IJ SOLN
INTRAMUSCULAR | Status: AC
  Filled 2019-04-21: qty 10

## 2019-04-21 MED ORDER — PROPOFOL 10 MG/ML IV BOLUS
INTRAVENOUS | Status: DC | PRN
  Administered 2019-04-21: 30 mg via INTRAVENOUS
  Administered 2019-04-21: 20 mg via INTRAVENOUS

## 2019-04-21 NOTE — Interval H&P Note (Signed)
History and Physical Interval Note:  04/21/2019 1:22 PM  Tahir L Scheunemann  has presented today for surgery, with the diagnosis of FAMILY HX.OF [place].  The various methods of treatment have been discussed with the patient and family. After consideration of risks, benefits and other options for treatment, the patient has consented to  Procedure(s): COLONOSCOPY WITH PROPOFOL (N/A) as a surgical intervention.  The patient's history has been reviewed, patient examined, no change in status, stable for surgery.  I have reviewed the patient's chart and labs.  Questions were answered to the patient's satisfaction.     Godfrey, [place]

## 2019-04-21 NOTE — Transfer of Care (Signed)
Immediate Anesthesia Transfer of Care Note  Patient: Brandon [name]  Procedure(s) Performed: COLONOSCOPY WITH PROPOFOL (N/A )  Patient Location: PACU  Anesthesia Type:General  Level of Consciousness: sedated  Airway & Oxygen Therapy: Patient Spontanous Breathing and Patient connected to nasal cannula oxygen  Post-op Assessment: Report given to RN and Post -op Vital signs reviewed and stable  Post vital signs: Reviewed and stable  Last Vitals:  Vitals Value Taken Time  BP    Temp    Pulse    Resp    SpO2      Last Pain: There were no vitals filed for this visit.       Complications: No apparent anesthesia complications

## 2019-04-21 NOTE — Anesthesia Postprocedure Evaluation (Signed)
Anesthesia Post Note  Patient: Lorance L Byer  Procedure(s) Performed: COLONOSCOPY WITH PROPOFOL (N/A )  Patient location during evaluation: Endoscopy Anesthesia Type: General Level of consciousness: awake and alert Pain management: pain level controlled Vital Signs Assessment: post-procedure vital signs reviewed and stable Respiratory status: spontaneous breathing, nonlabored ventilation and respiratory function stable Cardiovascular status: blood pressure returned to baseline and stable Postop Assessment: no apparent nausea or vomiting Anesthetic complications: no     Last Vitals:  Vitals:   04/21/19 1257 04/21/19 1349  BP: (!) 151/99 111/77  Pulse: 84 88  Resp: 18 17  Temp: 36.8 C (!) 36.2 C  SpO2: 97% 97%    Last Pain:  Vitals:   04/21/19 1418  TempSrc:   PainSc: 0-No pain                 Alphonsus Sias

## 2019-04-21 NOTE — Anesthesia Post-op Follow-up Note (Signed)
Anesthesia QCDR form completed.        

## 2019-04-21 NOTE — Anesthesia Preprocedure Evaluation (Signed)
Anesthesia Evaluation  Patient identified by MRN, date of birth, ID band Patient awake    Reviewed: Allergy & Precautions, H&P , NPO status , reviewed documented beta blocker date and time   Airway Mallampati: II  TM Distance: >3 FB Neck ROM: full    Dental  (+) Teeth Intact   Pulmonary    Pulmonary exam normal        Cardiovascular hypertension, Normal cardiovascular exam     Neuro/Psych    GI/Hepatic GERD  Medicated and Controlled,  Endo/Other  diabetes, Type 2  Renal/GU      Musculoskeletal   Abdominal   Peds  Hematology   Anesthesia Other Findings Past Medical History: No date: BPH (benign prostatic hyperplasia) No date: Diabetes mellitus without complication (HCC)     Comment:  type 2 No date: GERD (gastroesophageal reflux disease)  Past Surgical History: No date: CHOLECYSTECTOMY No date: HERNIA REPAIR No date: TONSILLECTOMY  BMI    Body Mass Index: 32.28 kg/m      Reproductive/Obstetrics                             Anesthesia Physical Anesthesia Plan  ASA: III  Anesthesia Plan: General   Post-op Pain Management:    Induction: Intravenous  PONV Risk Score and Plan: 2 and Treatment may vary due to age or medical condition and TIVA  Airway Management Planned: Nasal Cannula and Natural Airway  Additional Equipment:   Intra-op Plan:   Post-operative Plan:   Informed Consent: I have reviewed the patients History and Physical, chart, labs and discussed the procedure including the risks, benefits and alternatives for the proposed anesthesia with the patient or authorized representative who has indicated his/her understanding and acceptance.     Dental Advisory Given  Plan Discussed with: CRNA  Anesthesia Plan Comments:         Anesthesia Quick Evaluation

## 2019-04-21 NOTE — H&P (Signed)
Outpatient short stay form Pre-procedure 04/21/2019 1:21 PM [place] Brandon [name], M.D.  Primary Physician: Meriel Flavors, M.D.  Reason for visit:  Family hx of colon cancer.   History of present illness:   *63 year old [name] presenting for family history of colon cancer. [name] denies any change in bowel habits, rectal bleeding or involuntary weight loss.   Current Facility-Administered Medications:  .  0.9 %  sodium chloride infusion, , Intravenous, Continuous, Brandon Silvas, MD, Last Rate: 20 mL/hr at 04/21/19 1305  Medications Prior to Admission  Medication Sig Dispense Refill Last Dose  . Ascorbic Acid (VITAMIN C) 1000 MG tablet Take 1,000 mg by mouth daily.   Past Week at Unknown time  . aspirin EC 81 MG tablet Take 81 mg by mouth daily.   Past Week at Unknown time  . Insulin Glargine (LANTUS SOLOSTAR) 100 UNIT/ML Solostar Pen Inject 50 Units into the skin at bedtime.   04/20/2019 at 1400  . Multiple Vitamin (MULTI-VITAMINS) TABS Take 1 tablet by mouth daily.   Past Week at Unknown time  . zinc gluconate 50 MG tablet Take 50 mg by mouth daily.   Past Month at Unknown time  . insulin lispro (HUMALOG KWIKPEN) 100 UNIT/ML KwikPen Inject 8-10 Units into the skin 3 (three) times daily with meals.   04/19/2019  . lisinopril (PRINIVIL,ZESTRIL) 5 MG tablet Take 5 mg by mouth daily.   04/19/2019  . metFORMIN (GLUCOPHAGE) 500 MG tablet Take 500 mg by mouth 2 (two) times daily.   04/19/2019  . omeprazole (PRILOSEC) 20 MG capsule Take 20 mg by mouth daily.   04/19/2019  . pravastatin (PRAVACHOL) 20 MG tablet Take 20 mg by mouth at bedtime.   04/19/2019     No Known Allergies   Past Medical History:  Diagnosis Date  . BPH (benign prostatic hyperplasia)   . Diabetes mellitus without complication ([place])    type 2  . GERD (gastroesophageal reflux disease)     Review of systems:  Otherwise negative.    Physical Exam  Gen: Alert, oriented. Appears stated age.  HEENT: [place]/AT.  PERRLA. Lungs: CTA, no wheezes. CV: RR nl S1, S2. Abd: soft, benign, no masses. BS+ Ext: No edema. Pulses 2+    Planned procedures: Proceed with colonoscopy. The [name] understands the nature of the planned procedure, indications, risks, alternatives and potential complications including but not limited to bleeding, infection, perforation, damage to internal organs and possible oversedation/side effects from anesthesia. The [name] agrees and gives consent to proceed.  Please refer to procedure notes for findings, recommendations and [name] disposition/instructions.     Brandon [name] K. Alice [name], M.D. Gastroenterology 04/21/2019  1:21 PM

## 2019-04-21 NOTE — Op Note (Signed)
[facility] Patient Name: Brandon [name] Procedure Date: 04/21/2019 11:50 AM MRN: [id number] Account #: [id number] Date of Birth: 11/09/1955 Admit Type: Outpatient Age: [age] Room[facility] ENDO ROOM 3 Gender: Male Note Status: Finalized Procedure:            Colonoscopy Indications:          Screening in patient at increased risk: Family history                        of 1st-degree relative with colorectal cancer Providers:            Boykin Nearingeodoro K. Rishita Petron MD, MD Medicines:            Propofol per Anesthesia Complications:        No immediate complications. Procedure:            Pre-Anesthesia Assessment:                       - The risks and benefits of the procedure and the                        sedation options and risks were discussed with the                        patient. All questions were answered and informed                        consent was obtained.                       - Patient identification and proposed procedure were                        verified prior to the procedure by the nurse. The                        procedure was verified in the procedure room.                       - ASA Grade Assessment: III - A patient with severe                        systemic disease.                       - After reviewing the risks and benefits, the patient                        was deemed in satisfactory condition to undergo the                        procedure.                       After obtaining informed consent, the colonoscope was                        passed under direct vision. Throughout the procedure,                        the patient's blood pressure, pulse, and oxygen  saturations were monitored continuously. The                        Colonoscope was introduced through the anus and                        advanced to the the terminal ileum, with identification                        of the appendiceal orifice and IC  valve. The                        colonoscopy was performed without difficulty. The                        patient tolerated the procedure well. The quality of                        the bowel preparation was good. The terminal ileum,                        ileocecal valve, appendiceal orifice, and rectum were                        photographed. Findings:      The perianal and digital rectal examinations were normal. Pertinent       negatives include normal sphincter tone and no palpable rectal lesions.      Many small and large-mouthed diverticula were found in the sigmoid colon.      The entire examined colon appeared normal on direct and retroflexion       views. Impression:           - Diverticulosis in the sigmoid colon.                       - The entire examined colon is normal on direct and                        retroflexion views.                       - No specimens collected. Recommendation:       - Patient has a contact number available for                        emergencies. The signs and symptoms of potential                        delayed complications were discussed with the patient.                        Return to normal activities tomorrow. Written discharge                        instructions were provided to the patient.                       - Resume previous diet.                       - Continue present medications.                       -  Repeat colonoscopy in 5 years for screening purposes.                       - The findings and recommendations were discussed with                        the patient.                       - Return to GI office PRN. Procedure Code(s):    --- Professional ---                       K3546, Colorectal cancer screening; colonoscopy on                        individual at high risk Diagnosis Code(s):    --- Professional ---                       Z80.0, Family history of malignant neoplasm of                        digestive  organs                       K57.30, Diverticulosis of large intestine without                        perforation or abscess without bleeding CPT copyright 2019 American Medical Association. All rights reserved. The codes documented in this report are preliminary and upon coder review may  be revised to meet current compliance requirements. Efrain Sella MD, MD 04/21/2019 1:43:33 PM This report has been signed electronically. Number of Addenda: 0 Note Initiated On: 04/21/2019 11:50 AM Scope Withdrawal Time: 0 hours 5 minutes 54 seconds  Total Procedure Duration: 0 hours 10 minutes 48 seconds  Estimated Blood Loss: Estimated blood loss: none.      [facility]

## 2019-04-24 ENCOUNTER — Encounter: Payer: Self-pay | Admitting: Internal Medicine

## 2019-05-14 ENCOUNTER — Other Ambulatory Visit: Payer: Self-pay

## 2019-05-14 ENCOUNTER — Emergency Department
Admission: EM | Admit: 2019-05-14 | Discharge: 2019-05-15 | Disposition: A | Discharge status: Home / Self Care | Payer: Managed Care, Other (non HMO) | Attending: Emergency Medicine | Admitting: Emergency Medicine

## 2019-05-14 DIAGNOSIS — Z79899 Other long term (current) drug therapy: Secondary | ICD-10-CM | POA: Insufficient documentation

## 2019-05-14 DIAGNOSIS — R112 Nausea with vomiting, unspecified: Secondary | ICD-10-CM | POA: Diagnosis not present

## 2019-05-14 DIAGNOSIS — I10 Essential (primary) hypertension: Secondary | ICD-10-CM | POA: Diagnosis not present

## 2019-05-14 DIAGNOSIS — R109 Unspecified abdominal pain: Secondary | ICD-10-CM | POA: Diagnosis present

## 2019-05-14 DIAGNOSIS — N201 Calculus of ureter: Secondary | ICD-10-CM

## 2019-05-14 DIAGNOSIS — Z794 Long term (current) use of insulin: Secondary | ICD-10-CM | POA: Insufficient documentation

## 2019-05-14 DIAGNOSIS — N1330 Unspecified hydronephrosis: Secondary | ICD-10-CM | POA: Diagnosis not present

## 2019-05-14 DIAGNOSIS — E119 Type 2 diabetes mellitus without complications: Secondary | ICD-10-CM | POA: Diagnosis not present

## 2019-05-14 HISTORY — DX: Personal history of urinary calculi: Z87.442

## 2019-05-14 LAB — URINALYSIS, COMPLETE (UACMP) WITH MICROSCOPIC
Bacteria, UA: NONE SEEN
Bilirubin Urine: NEGATIVE
Glucose, UA: 50 mg/dL — AB
Hgb urine dipstick: NEGATIVE
Ketones, ur: 20 mg/dL — AB
Leukocytes,Ua: NEGATIVE
Nitrite: NEGATIVE
Protein, ur: NEGATIVE mg/dL
Specific Gravity, Urine: 1.027 (ref 1.005–1.030)
pH: 6 (ref 5.0–8.0)

## 2019-05-14 LAB — CBC
HCT: 38.2 % — ABNORMAL LOW (ref 39.0–52.0)
Hemoglobin: 13.5 g/dL (ref 13.0–17.0)
MCH: 31.9 pg (ref 26.0–34.0)
MCHC: 35.3 g/dL (ref 30.0–36.0)
MCV: 90.3 fL (ref 80.0–100.0)
Platelets: 160 10*3/uL (ref 150–400)
RBC: 4.23 MIL/uL (ref 4.22–5.81)
RDW: 12.4 % (ref 11.5–15.5)
WBC: 9 10*3/uL (ref 4.0–10.5)
nRBC: 0 % (ref 0.0–0.2)

## 2019-05-14 LAB — BASIC METABOLIC PANEL
Anion gap: 12 (ref 5–15)
BUN: 20 mg/dL (ref 8–23)
CO2: 23 mmol/L (ref 22–32)
Calcium: 8.7 mg/dL — ABNORMAL LOW (ref 8.9–10.3)
Chloride: 101 mmol/L (ref 98–111)
Creatinine, Ser: 1.29 mg/dL — ABNORMAL HIGH (ref 0.61–1.24)
GFR calc Af Amer: >60 mL/min (ref >60)
GFR calc non Af Amer: 58 mL/min — ABNORMAL LOW (ref >60)
Glucose, Bld: 247 mg/dL — ABNORMAL HIGH (ref 70–99)
Potassium: 4.1 mmol/L (ref 3.5–5.1)
Sodium: 136 mmol/L (ref 135–145)

## 2019-05-14 NOTE — ED Triage Notes (Signed)
Patient c/o left flank pain and emesis, reports hx of kidney stones. Patient has been taking OTC meds with minimal relieft.

## 2019-05-15 ENCOUNTER — Ambulatory Visit
Admission: RE | Admit: 2019-05-15 | Discharge: 2019-05-15 | Disposition: A | Discharge status: Home / Self Care | Payer: Managed Care, Other (non HMO) | Source: Ambulatory Visit | Attending: Urology | Admitting: Urology

## 2019-05-15 ENCOUNTER — Other Ambulatory Visit: Payer: Self-pay

## 2019-05-15 ENCOUNTER — Ambulatory Visit: Payer: Managed Care, Other (non HMO) | Admitting: Urology

## 2019-05-15 ENCOUNTER — Telehealth: Payer: Self-pay | Admitting: Urology

## 2019-05-15 ENCOUNTER — Emergency Department: Payer: Managed Care, Other (non HMO)

## 2019-05-15 ENCOUNTER — Encounter: Payer: Self-pay | Admitting: Emergency Medicine

## 2019-05-15 ENCOUNTER — Encounter: Payer: Self-pay | Admitting: Urology

## 2019-05-15 VITALS — BP 143/79 | HR 102 | Ht 70.5 in | Wt 234.0 lb

## 2019-05-15 DIAGNOSIS — N200 Calculus of kidney: Secondary | ICD-10-CM

## 2019-05-15 DIAGNOSIS — N132 Hydronephrosis with renal and ureteral calculous obstruction: Secondary | ICD-10-CM | POA: Diagnosis not present

## 2019-05-15 DIAGNOSIS — N201 Calculus of ureter: Secondary | ICD-10-CM

## 2019-05-15 DIAGNOSIS — I38 Endocarditis, valve unspecified: Secondary | ICD-10-CM | POA: Insufficient documentation

## 2019-05-15 MED ORDER — SODIUM CHLORIDE 0.9 % IV BOLUS
1000.0000 mL | Freq: Once | INTRAVENOUS | Status: AC
  Administered 2019-05-15: 1000 mL via INTRAVENOUS

## 2019-05-15 MED ORDER — MORPHINE SULFATE (PF) 4 MG/ML IV SOLN
4.0000 mg | Freq: Once | INTRAVENOUS | Status: AC
  Administered 2019-05-15: 01:00:00 4 mg via INTRAVENOUS
  Filled 2019-05-15: qty 1

## 2019-05-15 MED ORDER — ONDANSETRON 4 MG PO TBDP: ORAL_TABLET | ORAL | 0 refills | Status: DC

## 2019-05-15 MED ORDER — KETOROLAC TROMETHAMINE 30 MG/ML IJ SOLN
15.0000 mg | Freq: Once | INTRAMUSCULAR | Status: AC
  Administered 2019-05-15: 01:00:00 15 mg via INTRAVENOUS
  Filled 2019-05-15: qty 1

## 2019-05-15 MED ORDER — DOCUSATE SODIUM 100 MG PO CAPS: ORAL_CAPSULE | ORAL | 0 refills | Status: DC

## 2019-05-15 MED ORDER — ONDANSETRON HCL 4 MG/2ML IJ SOLN
4.0000 mg | INTRAMUSCULAR | Status: AC
  Administered 2019-05-15: 4 mg via INTRAVENOUS
  Filled 2019-05-15: qty 2

## 2019-05-15 NOTE — ED Notes (Signed)
In to meet pt, visitor says he has gone to [place]; she mentioned pt took Oxycodone some time before arrival but pt had told her he felt like it was starting to "wear off";

## 2019-05-15 NOTE — Telephone Encounter (Signed)
MADE APPOINTMENT

## 2019-05-15 NOTE — ED Provider Notes (Signed)
[facility] Emergency Department Provider Note  ____________________________________________   First MD Initiated Contact with Patient 05/15/19 0014     (approximate)  I have reviewed the triage vital signs and the nursing notes.   HISTORY  Chief Complaint Flank Pain    HPI Brandon [name] is a 63 y.o. male with medical history as listed below which is notable for history of kidney stones, last episode about 10 years ago, requiring lithotripsy.  He presents tonight for evaluation of episodic left flank pain that is been present on and off for about 48 hours but got significantly worse tonight.  He has had nausea and vomiting and has been unable to eat or drink very much today.  The pain is both sharp and aching and feels similar to what he remembers from his prior pain.  He has not had any difficulty urinating, denies dysuria, and has not seen any blood in the urine.  He has not had any contact with known COVID-19 patients.  He denies sore throat, chest pain, cough, shortness of breath, and abdominal pain other than the pain in his left flank.  He contacted his primary care provider yesterday and they called in some oxycodone and Flomax.  The oxycodone helped a little bit but the pain came back and was severe tonight when he came to the emergency department.         Past Medical History:  Diagnosis Date   BPH (benign prostatic hyperplasia)    Diabetes mellitus without complication (HCC)    type 2   GERD (gastroesophageal reflux disease)    History of kidney stones    required lithotripsy around 2010, sees Dr. Lonna CobbStoioff    Patient Active Problem List   Diagnosis Date Noted   Hypoglycemia due to insulin 09/20/2018   Diabetes (HCC) 09/20/2018   HTN (hypertension) 09/20/2018   GERD (gastroesophageal reflux disease) 09/20/2018    Past Surgical History:  Procedure Laterality Date   CHOLECYSTECTOMY     COLONOSCOPY WITH PROPOFOL N/A 04/21/2019   Procedure: COLONOSCOPY WITH PROPOFOL;  Surgeon: Toledo, Boykin Nearingeodoro K, MD;  Location: [facility] ENDOSCOPY;  Service: Endoscopy;  Laterality: N/A;   HERNIA REPAIR     TONSILLECTOMY      Prior to Admission medications   Medication Sig Start Date End Date Taking? Authorizing Provider  Ascorbic Acid (VITAMIN C) 1000 MG tablet Take 1,000 mg by mouth daily.    [provider]  aspirin EC 81 MG tablet Take 81 mg by mouth daily.    [provider]  docusate sodium (COLACE) 100 MG capsule Take 1 tablet once or twice daily as needed for constipation while taking narcotic pain medicine 05/15/19   Loleta RoseForbach, Estill Llerena, MD  Insulin Glargine (LANTUS SOLOSTAR) 100 UNIT/ML Solostar Pen Inject 50 Units into the skin at bedtime. 06/14/18   [provider]  insulin lispro (HUMALOG KWIKPEN) 100 UNIT/ML KwikPen Inject 8-10 Units into the skin 3 (three) times daily with meals. 06/17/18 06/17/19  [provider]  lisinopril (PRINIVIL,ZESTRIL) 5 MG tablet Take 5 mg by mouth daily. 06/17/18   [provider]  metFORMIN (GLUCOPHAGE) 500 MG tablet Take 500 mg by mouth 2 (two) times daily. 05/12/18   [provider]  Multiple Vitamin (MULTI-VITAMINS) TABS Take 1 tablet by mouth daily.    [provider]  omeprazole (PRILOSEC) 20 MG capsule Take 20 mg by mouth daily.    [provider]  ondansetron (ZOFRAN ODT) 4 MG disintegrating tablet Allow 1-2  tablets to dissolve in your mouth every 8 hours as needed for nausea/vomiting 05/15/19   Hinda Kehr, MD  pravastatin (PRAVACHOL) 20 MG tablet Take 20 mg by mouth at bedtime. 05/12/18   [provider]  zinc gluconate 50 MG tablet Take 50 mg by mouth daily.    [provider]    Allergies Patient has no known allergies.  Family History  Problem Relation Age of Onset   Diabetes Mother    Diabetes Father     Social History Social History   Tobacco Use   Smoking status: Never Smoker   Smokeless  tobacco: Never Used  Substance Use Topics   Alcohol use: Never    Frequency: Never   Drug use: Never    Review of Systems Constitutional: No fever/chills Eyes: No visual changes. ENT: No sore throat. Cardiovascular: Denies chest pain. Respiratory: Denies shortness of breath. Gastrointestinal: Left flank pain without abdominal pain with associated nausea and vomiting.  Denies diarrhea and constipation. Genitourinary: Negative for dysuria.  Negative for hematuria. Musculoskeletal: Left flank pain.  Negative for neck pain.   Integumentary: Negative for rash. Neurological: Negative for headaches, focal weakness or numbness.   ____________________________________________   PHYSICAL EXAM:  VITAL SIGNS: ED Triage Vitals  Enc Vitals Group     BP 05/15/19 0044 139/78     Pulse Rate 05/15/19 0044 90     Resp 05/15/19 0044 17     Temp 05/15/19 0044 98.4 F (36.9 C)     Temp Source 05/15/19 0044 Oral     SpO2 05/15/19 0044 95 %     Weight 05/14/19 2250 103.4 kg (228 lb)     Height 05/14/19 2250 1.803 m (5\' 11" )     Head Circumference --      Peak Flow --      Pain Score 05/14/19 2250 5     Pain Loc --      Pain Edu? --      Excl. in [place]? --     Constitutional: Alert and oriented.  Appears uncomfortable but not in severe distress. Eyes: Conjunctivae are normal.  Head: Atraumatic. Nose: No congestion/rhinnorhea. Mouth/Throat: Mucous membranes are moist. Neck: No stridor.  No meningeal signs.   Cardiovascular: Normal rate, regular rhythm. Good peripheral circulation. Grossly normal heart sounds. Respiratory: Normal respiratory effort.  No retractions. Gastrointestinal: Soft and nontender. No distention.  Musculoskeletal: Left CVA tenderness to percussion.  No lower extremity tenderness nor edema. No gross deformities of extremities. Neurologic:  Normal speech and language. No gross focal neurologic deficits are appreciated.  Skin:  Skin is warm, dry and intact. Psychiatric:  Mood and affect are normal. Speech and behavior are normal.  ____________________________________________   LABS (all labs ordered are listed, but only abnormal results are displayed)  Labs Reviewed  URINALYSIS, COMPLETE (UACMP) WITH MICROSCOPIC - Abnormal; Notable for the following components:      Result Value   Color, Urine YELLOW (*)    APPearance CLEAR (*)    Glucose, UA 50 (*)    Ketones, ur 20 (*)    All other components within normal limits  BASIC METABOLIC PANEL - Abnormal; Notable for the following components:   Glucose, Bld 247 (*)    Creatinine, Ser 1.29 (*)    Calcium 8.7 (*)    GFR calc non Af Amer 58 (*)    All other components within normal limits  CBC - Abnormal; Notable for the following components:   HCT 38.2 (*)  All other components within normal limits   ____________________________________________  EKG  No indication for EKG ____________________________________________  RADIOLOGY I, Loleta Roseory Caspian Deleonardis, personally viewed and evaluated these images (plain radiographs) as part of my medical decision making, as well as reviewing the written report by the radiologist.  ED MD interpretation: 9 mm left UPJ stone with mild hydronephrosis.  Multiple other nonobstructing bilateral renal calculi.  Official radiology report(s): Ct Renal Stone Study  Result Date: 05/15/2019 CLINICAL DATA:  63 year old male with flank pain. Concern for kidney stone. EXAM: CT ABDOMEN AND PELVIS WITHOUT CONTRAST TECHNIQUE: Multidetector CT imaging of the abdomen and pelvis was performed following the standard protocol without IV contrast. COMPARISON:  Renal ultrasound report dated 05/31/2006. The images are not available for direct comparison. FINDINGS: Evaluation of this exam is limited in the absence of intravenous contrast. Lower chest: The visualized lung bases are clear. No intra-abdominal free air or free fluid. Hepatobiliary: The liver is unremarkable. No intrahepatic biliary ductal  dilatation. Cholecystectomy. No retained calcified stone noted in the central CBD. Pancreas: Unremarkable. No pancreatic ductal dilatation or surrounding inflammatory changes. Spleen: Normal in size without focal abnormality. Adrenals/Urinary Tract: The adrenal glands are unremarkable. There is a 9 mm left ureteropelvic junction stone with mild left hydronephrosis. Several additional nonobstructing left renal calculi measure up to 11 mm in the interpolar aspect of the left kidney. A 5 mm nonobstructing stone in the interpolar aspect the right kidney noted. There is no hydronephrosis on the right. The right ureter and urinary bladder are unremarkable. Stomach/Bowel: There is extensive sigmoid diverticulosis without active inflammatory changes. There is a small hiatal hernia. There is no bowel obstruction or active inflammation. Normal appendix. Vascular/Lymphatic: The abdominal aorta and IVC are unremarkable on this noncontrast CT. No portal venous gas. There is no adenopathy. Reproductive: The prostate is grossly unremarkable. The seminal vesicles are symmetric. Other: Bilateral inguinal hernia repair mesh. Musculoskeletal: T11 superior endplate Schmorl's node. No acute osseous pathology. IMPRESSION: 1. A 9 mm left UPJ stone with mild left hydronephrosis. Multiple other nonobstructing bilateral renal calculi noted. No hydronephrosis on the right. 2. Extensive sigmoid diverticulosis. No bowel obstruction or active inflammation. Normal appendix. Electronically Signed   By: Elgie CollardArash  Radparvar M.D.   On: 05/15/2019 00:45    ____________________________________________   PROCEDURES   Procedure(s) performed (including Critical Care):  Procedures   ____________________________________________   INITIAL IMPRESSION / MDM / ASSESSMENT AND PLAN / ED COURSE  As part of my medical decision making, I reviewed the following data within the electronic MEDICAL RECORD NUMBER Nursing notes reviewed and incorporated, Labs  reviewed , Old chart reviewed and Notes from prior ED visits   Differential diagnosis includes, but is not limited to, renal colic/ureteral stone, UTI, renal infarction, musculoskeletal strain.  The patient's vital signs are stable and he is afebrile with no risk factors for COVID-19.  Metabolic panel is notable for a mild elevation of his creatinine over what appears to be his baseline but he does not qualify as renal failure and I am giving him a liter of IV fluids.  His urinalysis is notable for microscopic hematuria and some mild ketonuria without evidence of infection.  CBC is normal.  The CT scan is notable for a 9 mm left UPJ stone with mild hydronephrosis.  However he has no signs or symptoms of infection.  I am giving morphine 4 mg IV, Zofran 4 mg IV, and Toradol 15 mg IV.  If pain is adequately controlled, anticipate the patient will  be able to be discharged for close outpatient follow-up with Dr. Lonna CobbStoioff.  He is in agreement with the plan.      Clinical Course as of May 14 210  Mon May 15, 2019  0207 The patient's pain is well controlled and he says he feels ready to go home.  I gave the patient my usual recommendations that he can take ibuprofen as well as his prescribed Percocet through Monday, and he should call his urologist today to schedule follow-up appointment to see if he will be eligible for lithotripsy on Thursday.  He understands and agrees with the plan.  I gave my usual customary return precautions.   [CF]    Clinical Course User Index [CF] Loleta RoseForbach, Enjoli Tidd, MD     ____________________________________________  FINAL CLINICAL IMPRESSION(S) / ED DIAGNOSES  Final diagnoses:  Left ureteral stone  Hydronephrosis of left kidney     MEDICATIONS GIVEN DURING THIS VISIT:  Medications  morphine 4 MG/ML injection 4 mg (4 mg Intravenous Given 05/15/19 0042)  ondansetron (ZOFRAN) injection 4 mg (4 mg Intravenous Given 05/15/19 0038)  ketorolac (TORADOL) 30 MG/ML injection  15 mg (15 mg Intravenous Given 05/15/19 0040)  sodium chloride 0.9 % bolus 1,000 mL (1,000 mLs Intravenous New Bag/Given 05/15/19 0037)     ED Discharge Orders         Ordered    ondansetron (ZOFRAN ODT) 4 MG disintegrating tablet     05/15/19 0210    docusate sodium (COLACE) 100 MG capsule     05/15/19 0210          *Please note:  Brandon [name] was evaluated in Emergency Department on 05/15/2019 for the symptoms described in the history of present illness. He was evaluated in the context of the global COVID-19 pandemic, which necessitated consideration that the patient might be at risk for infection with the SARS-CoV-2 virus that causes COVID-19. Institutional protocols and algorithms that pertain to the evaluation of patients at risk for COVID-19 are in a state of rapid change based on information released by regulatory bodies including the CDC and federal and state organizations. These policies and algorithms were followed during the patient's care in the ED.  Some ED evaluations and interventions may be delayed as a result of limited staffing during the pandemic.*  Note:  This document was prepared using Dragon voice recognition software and may include unintentional dictation errors.   Loleta RoseForbach, Pang Robers, MD 05/15/19 [phone]

## 2019-05-15 NOTE — Discharge Instructions (Signed)
You have been seen in the Emergency Department (ED) today for pain caused by a 9-mm stone in your left ureter.  As we have discussed, please drink plenty of fluids.  Please make a follow up appointment with the physician(s) listed elsewhere in this documentation.  You may take pain medication as needed but ONLY as prescribed.  Please also take your prescribed Flomax daily.  We also recommend that you take over-the-counter ibuprofen regularly according to label instructions, but do not take it after Monday in case you might be eligible for lithotripsy on Thursday.  Take it with meals to minimize stomach discomfort.  Please see your doctor as soon as possible as stones may take 1-3 weeks to pass and you may require additional care or medications.  Do not drink alcohol, drive or participate in any other potentially dangerous activities while taking opiate pain medication as it may make you sleepy. Do not take this medication with any other sedating medications, either prescription or over-the-counter. If you were prescribed Percocet or Vicodin, do not take these with acetaminophen (Tylenol) as it is already contained within these medications.   Take Percocet (previously prescribed) as needed for severe pain.  This medication is an opiate (or narcotic) pain medication and can be habit forming.  Use it as little as possible to achieve adequate pain control.  Do not use or use it with extreme caution if you have a history of opiate abuse or dependence.  If you are on a pain contract with your primary care doctor or a pain specialist, be sure to let them know you were prescribed this medication today from the [facility] Emergency Department.  This medication is intended for your use only - do not give any to anyone else and keep it in a secure place where nobody else, especially children, have access to it.  It will also cause or worsen constipation, so you may want to consider taking an over-the-counter  stool softener while you are taking this medication.  Return to the Emergency Department (ED) or call your doctor if you have any worsening pain, fever, painful urination, are unable to urinate, or develop other symptoms that concern you.

## 2019-05-15 NOTE — ED Notes (Signed)
Dr Forbach in to follow up with pt 

## 2019-05-15 NOTE — Progress Notes (Signed)
05/15/2019 2:39 PM   Jayant L Mcnelly 05/07/1956 [id number]  Referring provider: Marisue IvanLinthavong, Kanhka, MD [phone] [facility] [place],  [place] [address]  Chief Complaint  Patient presents with  . Nephrolithiasis    HPI: Brandon [name] is a 63 y.o. male seen in the ED on 05/14/2019 for left renal colic.  He had onset of mild left flank pain on 05/13/2019 and yesterday had significantly worsening left flank pain which he described as severe.  There were no identifiable precipitating, aggravating or alleviating factors.  He had nausea and vomiting.  Denied fever or chills.  He had no voiding complaints.  A stone protocol CT of the abdomen pelvis was performed which showed a 9 mm left proximal ureteral calculus with moderate hydronephrosis/hydroureter.  He also had nonobstructing left renal calculi with largest approximately 10 mm.  He had a nonobstructing 5 mm right renal calculus.  His pain was controlled with parenteral analgesics and he was discharged pain-free.  Since his ED visit he has had mild to moderate pain controlled with his oral analgesics.  I treated him for a ureteral calculus in 2007 with shockwave lithotripsy with good results.  PMH: Past Medical History:  Diagnosis Date  . BPH (benign prostatic hyperplasia)   . Diabetes mellitus without complication (HCC)    type 2  . GERD (gastroesophageal reflux disease)   . History of kidney stones    required lithotripsy around 2010, sees Dr. Lonna CobbStoioff    Surgical History: Past Surgical History:  Procedure Laterality Date  . CHOLECYSTECTOMY    . COLONOSCOPY WITH PROPOFOL N/A 04/21/2019   Procedure: COLONOSCOPY WITH PROPOFOL;  Surgeon: Toledo, Boykin Nearingeodoro K, MD;  Location: [facility] ENDOSCOPY;  Service: Endoscopy;  Laterality: N/A;  . HERNIA REPAIR    . TONSILLECTOMY      Home Medications:  Allergies as of 05/15/2019   No Known Allergies     Medication List       Accurate as of May 15, 2019  2:39 PM. If you have  any questions, ask your nurse or doctor.        aspirin EC 81 MG tablet Take 81 mg by mouth daily.   docusate sodium 100 MG capsule Commonly known as: Colace Take 1 tablet once or twice daily as needed for constipation while taking narcotic pain medicine   HumaLOG KwikPen 100 UNIT/ML KwikPen Generic drug: insulin lispro Inject 8-10 Units into the skin 3 (three) times daily with meals.   Lantus SoloStar 100 UNIT/ML Solostar Pen Generic drug: Insulin Glargine Inject 50 Units into the skin at bedtime.   lisinopril 5 MG tablet Commonly known as: ZESTRIL Take 5 mg by mouth daily.   meloxicam 15 MG tablet Commonly known as: MOBIC   metFORMIN 500 MG tablet Commonly known as: GLUCOPHAGE Take 500 mg by mouth 2 (two) times daily.   Multi-Vitamins Tabs Take 1 tablet by mouth daily.   omeprazole 20 MG capsule Commonly known as: PRILOSEC Take 20 mg by mouth daily.   ondansetron 4 MG disintegrating tablet Commonly known as: Zofran ODT Allow 1-2 tablets to dissolve in your mouth every 8 hours as needed for nausea/vomiting   OneTouch Delica Plus Lancet33G Misc Take by mouth.   OneTouch Ultra test strip Generic drug: glucose blood Use 4 (four) times daily   oxyCODONE-acetaminophen 5-325 MG tablet Commonly known as: PERCOCET/ROXICET Take by mouth.   pravastatin 20 MG tablet Commonly known as: PRAVACHOL Take 20 mg by mouth at bedtime.   PriLOSEC  OTC 20 MG tablet Generic drug: omeprazole Take by mouth.   tamsulosin 0.4 MG Caps capsule Commonly known as: FLOMAX Take by mouth.   vitamin C 1000 MG tablet Take 1,000 mg by mouth daily.   zinc gluconate 50 MG tablet Take 50 mg by mouth daily.       Allergies: No Known Allergies  Family History: Family History  Problem Relation Age of Onset  . Diabetes Mother   . Diabetes Father     Social History:  reports that he has never smoked. He has never used smokeless tobacco. He reports that he does not drink alcohol  or use drugs.  ROS: UROLOGY Frequent Urination?: No Hard to postpone urination?: No Burning/pain with urination?: No Get up at night to urinate?: No Leakage of urine?: No Urine stream starts and stops?: No Trouble starting stream?: No Do you have to strain to urinate?: No Blood in urine?: No Urinary tract infection?: No Sexually transmitted disease?: No Injury to kidneys or bladder?: No Painful intercourse?: No Weak stream?: No Erection problems?: No Penile pain?: No  Gastrointestinal Nausea?: No Vomiting?: No Indigestion/heartburn?: No Diarrhea?: No Constipation?: No  Constitutional Fever: No Night sweats?: No Weight loss?: No Fatigue?: No  Skin Skin rash/lesions?: No Itching?: No  Eyes Blurred vision?: No Double vision?: No  Ears/Nose/Throat Sore throat?: No Sinus problems?: No  Hematologic/Lymphatic Swollen glands?: No Easy bruising?: No  Cardiovascular Leg swelling?: No Chest pain?: No  Respiratory Cough?: No Shortness of breath?: No  Endocrine Excessive thirst?: No  Musculoskeletal Back pain?: No Joint pain?: No  Neurological Headaches?: No Dizziness?: No  Psychologic Depression?: No Anxiety?: No  Physical Exam: BP (!) 143/79 (BP Location: Left Arm, Patient Position: Sitting, Cuff Size: Large)   Pulse (!) 102   Ht 5' 10.5" (1.791 m)   Wt 234 lb (106.1 kg)   BMI 33.10 kg/m   Constitutional:  Alert and oriented, No acute distress. HEENT: [place] AT, moist mucus membranes.  Trachea midline, no masses. Cardiovascular: No clubbing, cyanosis, or edema.  RRR Respiratory: Normal respiratory effort, no increased work of breathing.  Clear GI: Abdomen is soft, nontender, nondistended, no abdominal masses GU: No CVA tenderness Lymph: No cervical or inguinal lymphadenopathy. Skin: No rashes, bruises or suspicious lesions. Neurologic: Grossly intact, no focal deficits, moving all 4 extremities. Psychiatric: Normal mood and affect.   Laboratory Data: Lab Results  Component Value Date   WBC 9.0 05/14/2019   HGB 13.5 05/14/2019   HCT 38.2 (L) 05/14/2019   MCV 90.3 05/14/2019   PLT 160 05/14/2019    Lab Results  Component Value Date   CREATININE 1.29 (H) 05/14/2019    Urinalysis    Component Value Date/Time   COLORURINE YELLOW (A) 05/14/2019 2307   APPEARANCEUR CLEAR (A) 05/14/2019 2307   LABSPEC 1.027 05/14/2019 2307   PHURINE 6.0 05/14/2019 2307   GLUCOSEU 50 (A) 05/14/2019 2307   HGBUR NEGATIVE 05/14/2019 2307   BILIRUBINUR NEGATIVE 05/14/2019 2307   KETONESUR 20 (A) 05/14/2019 2307   PROTEINUR NEGATIVE 05/14/2019 2307   NITRITE NEGATIVE 05/14/2019 2307   LEUKOCYTESUR NEGATIVE 05/14/2019 2307    Lab Results  Component Value Date   BACTERIA NONE SEEN 05/14/2019    Pertinent Imaging: CT personally reviewed.  Stone density 890 HU  Results for orders placed during the hospital encounter of 05/14/19  CT Renal Stone Study   Narrative CLINICAL DATA:  63 year old male with flank pain. Concern for kidney stone.  EXAM: CT ABDOMEN AND PELVIS WITHOUT CONTRAST  TECHNIQUE: Multidetector CT imaging of the abdomen and pelvis was performed following the standard protocol without IV contrast.  COMPARISON:  Renal ultrasound report dated 05/31/2006. The images are not available for direct comparison.  FINDINGS: Evaluation of this exam is limited in the absence of intravenous contrast.  Lower chest: The visualized lung bases are clear.  No intra-abdominal free air or free fluid.  Hepatobiliary: The liver is unremarkable. No intrahepatic biliary ductal dilatation. Cholecystectomy. No retained calcified stone noted in the central CBD.  Pancreas: Unremarkable. No pancreatic ductal dilatation or surrounding inflammatory changes.  Spleen: Normal in size without focal abnormality.  Adrenals/Urinary Tract: The adrenal glands are unremarkable. There is a 9 mm left ureteropelvic junction stone with mild  left hydronephrosis. Several additional nonobstructing left renal calculi measure up to 11 mm in the interpolar aspect of the left kidney. A 5 mm nonobstructing stone in the interpolar aspect the right kidney noted. There is no hydronephrosis on the right. The right ureter and urinary bladder are unremarkable.  Stomach/Bowel: There is extensive sigmoid diverticulosis without active inflammatory changes. There is a small hiatal hernia. There is no bowel obstruction or active inflammation. Normal appendix.  Vascular/Lymphatic: The abdominal aorta and IVC are unremarkable on this noncontrast CT. No portal venous gas. There is no adenopathy.  Reproductive: The prostate is grossly unremarkable. The seminal vesicles are symmetric.  Other: Bilateral inguinal hernia repair mesh.  Musculoskeletal: T11 superior endplate Schmorl's node. No acute osseous pathology.  IMPRESSION: 1. A 9 mm left UPJ stone with mild left hydronephrosis. Multiple other nonobstructing bilateral renal calculi noted. No hydronephrosis on the right. 2. Extensive sigmoid diverticulosis. No bowel obstruction or active inflammation. Normal appendix.   Electronically Signed   By: Elgie CollardArash  Radparvar M.D.   On: 05/15/2019 00:45     Assessment & Plan:    - Left ureteral calculus with renal colic He was informed based on stone size it is unlikely to pass spontaneously.  Options were discussed including shockwave lithotripsy and ureteroscopy.  Based on his stone size and his other renal calculi would unable to be treated with shockwave lithotripsy however could be treated with ureteroscopy.  He has elected to schedule shockwave lithotripsy this week.  He will consider treatment of his nonobstructing renal calculi at a later date.  The indications and nature of the planned procedure were discussed as well as the potential benefits and expected outcome.  Alternatives were discussed as described above.  The most common  complications and side effects were discussed as outlined in the [facility] consent form.  It was stressed that there is no guarantee that lithotripsy will be successful and he could require retreatment or alternative treatment.  The rare instance of perirenal bleeding requiring hospitalization, transfusion and rarely surgery were discussed.  The possibility of renal colic from obstructing stone fragments requiring stent placement or ureteroscopy was also discussed.   Riki AltesScott C , MD  [facility] [address], [address] [place], [place] [phone]

## 2019-05-15 NOTE — ED Notes (Signed)
Dr Karma Greaser at bedside; pt reports left mid back pain that started Saturday morning after waking; says pain has progressively worsened over the last 2 days; history of kidney stones and says this feels similar; denies urinary retention; denies frank blood in urine; pt called primary Sunday morning and was called in RX's for oxycodone and flomax; pt has been taking pain medication as directed but is unable to get the pain down to "tolerable"; pt has seen Dr Bernardo Heater in the past for his kidney stones-last one was about 10 years ago; was told at that time he had some still in there; pt has had to have lithotripsy before to aid in stone removal; pt awake and alert; talking in complete coherent sentences;

## 2019-05-16 ENCOUNTER — Other Ambulatory Visit
Admission: RE | Admit: 2019-05-16 | Discharge: 2019-05-16 | Disposition: A | Discharge status: Home / Self Care | Payer: Managed Care, Other (non HMO) | Source: Ambulatory Visit | Attending: Urology | Admitting: Urology

## 2019-05-16 ENCOUNTER — Other Ambulatory Visit: Payer: Self-pay

## 2019-05-16 ENCOUNTER — Encounter: Payer: Self-pay | Admitting: Urology

## 2019-05-16 DIAGNOSIS — Z20828 Contact with and (suspected) exposure to other viral communicable diseases: Secondary | ICD-10-CM | POA: Insufficient documentation

## 2019-05-16 DIAGNOSIS — N201 Calculus of ureter: Secondary | ICD-10-CM | POA: Insufficient documentation

## 2019-05-16 DIAGNOSIS — Z01812 Encounter for preprocedural laboratory examination: Secondary | ICD-10-CM | POA: Insufficient documentation

## 2019-05-16 DIAGNOSIS — N200 Calculus of kidney: Secondary | ICD-10-CM | POA: Insufficient documentation

## 2019-05-16 DIAGNOSIS — N132 Hydronephrosis with renal and ureteral calculous obstruction: Secondary | ICD-10-CM | POA: Insufficient documentation

## 2019-05-17 LAB — SARS CORONAVIRUS 2 (TAT 6-24 HRS): SARS Coronavirus 2: NEGATIVE

## 2019-05-18 ENCOUNTER — Other Ambulatory Visit: Payer: Self-pay

## 2019-05-18 ENCOUNTER — Encounter: Payer: Self-pay | Admitting: *Deleted

## 2019-05-18 ENCOUNTER — Encounter
Admission: RE | Disposition: A | Discharge status: Home / Self Care | Payer: Self-pay | Source: Home / Self Care | Attending: Urology

## 2019-05-18 ENCOUNTER — Ambulatory Visit
Admission: RE | Admit: 2019-05-18 | Discharge: 2019-05-18 | Disposition: A | Discharge status: Home / Self Care | Payer: Managed Care, Other (non HMO) | Source: Home / Self Care | Attending: Urology | Admitting: Urology

## 2019-05-18 ENCOUNTER — Ambulatory Visit: Payer: Managed Care, Other (non HMO)

## 2019-05-18 DIAGNOSIS — N132 Hydronephrosis with renal and ureteral calculous obstruction: Secondary | ICD-10-CM

## 2019-05-18 DIAGNOSIS — R109 Unspecified abdominal pain: Secondary | ICD-10-CM | POA: Diagnosis not present

## 2019-05-18 DIAGNOSIS — N200 Calculus of kidney: Secondary | ICD-10-CM

## 2019-05-18 DIAGNOSIS — N202 Calculus of kidney with calculus of ureter: Secondary | ICD-10-CM | POA: Diagnosis not present

## 2019-05-18 HISTORY — PX: EXTRACORPOREAL SHOCK WAVE LITHOTRIPSY: SHX1557

## 2019-05-18 LAB — GLUCOSE, CAPILLARY: Glucose-Capillary: 261 mg/dL — ABNORMAL HIGH (ref 70–99)

## 2019-05-18 SURGERY — LITHOTRIPSY, ESWL
Anesthesia: Moderate Sedation | Laterality: Left

## 2019-05-18 MED ORDER — CIPROFLOXACIN HCL 500 MG PO TABS
ORAL_TABLET | ORAL | Status: AC
  Administered 2019-05-18: 500 mg via ORAL
  Filled 2019-05-18: qty 1

## 2019-05-18 MED ORDER — DIAZEPAM 5 MG PO TABS
10.0000 mg | ORAL_TABLET | Freq: Once | ORAL | Status: AC
  Administered 2019-05-18: 10 mg via ORAL

## 2019-05-18 MED ORDER — KETOROLAC TROMETHAMINE 30 MG/ML IJ SOLN
15.0000 mg | Freq: Once | INTRAMUSCULAR | Status: AC
  Administered 2019-05-18: 15 mg via INTRAVENOUS

## 2019-05-18 MED ORDER — DIPHENHYDRAMINE HCL 25 MG PO CAPS
25.0000 mg | ORAL_CAPSULE | Freq: Once | ORAL | Status: AC
  Administered 2019-05-18: 10:00:00 25 mg via ORAL

## 2019-05-18 MED ORDER — ONDANSETRON HCL 4 MG/2ML IJ SOLN
INTRAMUSCULAR | Status: AC
  Administered 2019-05-18: 4 mg via INTRAVENOUS
  Filled 2019-05-18: qty 2

## 2019-05-18 MED ORDER — DIAZEPAM 5 MG PO TABS
ORAL_TABLET | ORAL | Status: AC
  Administered 2019-05-18: 10 mg via ORAL
  Filled 2019-05-18: qty 2

## 2019-05-18 MED ORDER — KETOROLAC TROMETHAMINE 30 MG/ML IJ SOLN
INTRAMUSCULAR | Status: AC
  Administered 2019-05-18: 15 mg via INTRAVENOUS
  Filled 2019-05-18: qty 1

## 2019-05-18 MED ORDER — ONDANSETRON HCL 4 MG/2ML IJ SOLN
4.0000 mg | Freq: Once | INTRAMUSCULAR | Status: AC
  Administered 2019-05-18: 4 mg via INTRAVENOUS

## 2019-05-18 MED ORDER — CIPROFLOXACIN HCL 500 MG PO TABS
500.0000 mg | ORAL_TABLET | Freq: Once | ORAL | Status: AC
  Administered 2019-05-18: 10:00:00 500 mg via ORAL

## 2019-05-18 MED ORDER — SODIUM CHLORIDE 0.9 % IV SOLN
INTRAVENOUS | Status: DC
  Administered 2019-05-18: 11:00:00 via INTRAVENOUS

## 2019-05-18 MED ORDER — DIPHENHYDRAMINE HCL 25 MG PO CAPS
ORAL_CAPSULE | ORAL | Status: AC
  Administered 2019-05-18: 10:00:00 25 mg via ORAL
  Filled 2019-05-18: qty 1

## 2019-05-18 NOTE — H&P (Signed)
UROLOGY H&P UPDATE  Agree with prior H&P dated 05/15/19 by Dr. Bernardo Heater. 63 yo M with left flank pain secondary to a left 8mm proximal ureteral stone.  Cardiac: RRR Lungs: CTA bilaterally  Laterality: LEFT Procedure: SWL  Urine: urinalysis 8/10 no bacteria, 11-20 RBCs, 0 WBCs, nitrite negative  Informed consent obtained, we specifically discussed the risks of bleeding, infection, post-operative pain, need for additional procedures, steinstrasse, and obstructive fragments.  He has significant additional left renal stone burden, may benefit from elective left SWL for renal stones in the future.  Billey Co, MD 05/18/2019

## 2019-05-18 NOTE — Discharge Instructions (Signed)
AMBULATORY SURGERY  °DISCHARGE INSTRUCTIONS ° ° °1) The drugs that you were given will stay in your system until tomorrow so for the next 24 hours you should not: ° °A) Drive an automobile °B) Make any legal decisions °C) Drink any alcoholic beverage ° ° °2) You may resume regular meals tomorrow.  Today it is better to start with liquids and gradually work up to solid foods. ° °You may eat anything you prefer, but it is better to start with liquids, then soup and crackers, and gradually work up to solid foods. ° ° °3) Please notify your doctor immediately if you have any unusual bleeding, trouble breathing, redness and pain at the surgery site, drainage, fever, or pain not relieved by medication. ° °Please contact your physician with any problems or Same Day Surgery at [phone], Monday through Friday 6 am to 4 pm, or [place] [facility] [place] Main number at [phone]. °

## 2019-05-20 ENCOUNTER — Inpatient Hospital Stay
Admission: EM | Admit: 2019-05-20 | Discharge: 2019-05-22 | DRG: 661 | Disposition: A | Discharge status: Home / Self Care | Payer: Managed Care, Other (non HMO) | Attending: Internal Medicine | Admitting: Internal Medicine

## 2019-05-20 ENCOUNTER — Emergency Department: Payer: Managed Care, Other (non HMO)

## 2019-05-20 ENCOUNTER — Other Ambulatory Visit: Payer: Self-pay

## 2019-05-20 DIAGNOSIS — Z87442 Personal history of urinary calculi: Secondary | ICD-10-CM

## 2019-05-20 DIAGNOSIS — E119 Type 2 diabetes mellitus without complications: Secondary | ICD-10-CM | POA: Diagnosis present

## 2019-05-20 DIAGNOSIS — N200 Calculus of kidney: Secondary | ICD-10-CM

## 2019-05-20 DIAGNOSIS — N201 Calculus of ureter: Secondary | ICD-10-CM | POA: Diagnosis present

## 2019-05-20 DIAGNOSIS — N202 Calculus of kidney with calculus of ureter: Principal | ICD-10-CM | POA: Diagnosis present

## 2019-05-20 DIAGNOSIS — N401 Enlarged prostate with lower urinary tract symptoms: Secondary | ICD-10-CM | POA: Diagnosis present

## 2019-05-20 DIAGNOSIS — N3941 Urge incontinence: Secondary | ICD-10-CM | POA: Diagnosis not present

## 2019-05-20 DIAGNOSIS — Z20828 Contact with and (suspected) exposure to other viral communicable diseases: Secondary | ICD-10-CM | POA: Diagnosis present

## 2019-05-20 DIAGNOSIS — Z794 Long term (current) use of insulin: Secondary | ICD-10-CM

## 2019-05-20 DIAGNOSIS — I10 Essential (primary) hypertension: Secondary | ICD-10-CM | POA: Diagnosis present

## 2019-05-20 DIAGNOSIS — E7800 Pure hypercholesterolemia, unspecified: Secondary | ICD-10-CM | POA: Diagnosis present

## 2019-05-20 DIAGNOSIS — Z7982 Long term (current) use of aspirin: Secondary | ICD-10-CM

## 2019-05-20 DIAGNOSIS — K219 Gastro-esophageal reflux disease without esophagitis: Secondary | ICD-10-CM | POA: Diagnosis present

## 2019-05-20 DIAGNOSIS — Z79899 Other long term (current) drug therapy: Secondary | ICD-10-CM

## 2019-05-20 LAB — CBC WITH DIFFERENTIAL/PLATELET
Abs Immature Granulocytes: 0.03 10*3/uL (ref 0.00–0.07)
Basophils Absolute: 0 10*3/uL (ref 0.0–0.1)
Basophils Relative: 1 %
Eosinophils Absolute: 0.1 10*3/uL (ref 0.0–0.5)
Eosinophils Relative: 1 %
HCT: 39.3 % (ref 39.0–52.0)
Hemoglobin: 13.9 g/dL (ref 13.0–17.0)
Immature Granulocytes: 0 %
Lymphocytes Relative: 11 %
Lymphs Abs: 0.7 10*3/uL (ref 0.7–4.0)
MCH: 32 pg (ref 26.0–34.0)
MCHC: 35.4 g/dL (ref 30.0–36.0)
MCV: 90.3 fL (ref 80.0–100.0)
Monocytes Absolute: 0.5 10*3/uL (ref 0.1–1.0)
Monocytes Relative: 7 %
Neutro Abs: 5.6 10*3/uL (ref 1.7–7.7)
Neutrophils Relative %: 80 %
Platelets: 186 10*3/uL (ref 150–400)
RBC: 4.35 MIL/uL (ref 4.22–5.81)
RDW: 11.9 % (ref 11.5–15.5)
WBC: 7 10*3/uL (ref 4.0–10.5)
nRBC: 0 % (ref 0.0–0.2)

## 2019-05-20 LAB — URINALYSIS, COMPLETE (UACMP) WITH MICROSCOPIC
Bilirubin Urine: NEGATIVE
Glucose, UA: >=500 mg/dL — AB
Ketones, ur: 5 mg/dL — AB
Leukocytes,Ua: NEGATIVE
Nitrite: NEGATIVE
Protein, ur: 30 mg/dL — AB
RBC / HPF: >50 RBC/hpf — ABNORMAL HIGH (ref 0–5)
Specific Gravity, Urine: 1.015 (ref 1.005–1.030)
pH: 5 (ref 5.0–8.0)

## 2019-05-20 LAB — BASIC METABOLIC PANEL
Anion gap: 9 (ref 5–15)
BUN: 13 mg/dL (ref 8–23)
CO2: 26 mmol/L (ref 22–32)
Calcium: 8.8 mg/dL — ABNORMAL LOW (ref 8.9–10.3)
Chloride: 100 mmol/L (ref 98–111)
Creatinine, Ser: 0.91 mg/dL (ref 0.61–1.24)
GFR calc Af Amer: >60 mL/min (ref >60)
GFR calc non Af Amer: >60 mL/min (ref >60)
Glucose, Bld: 307 mg/dL — ABNORMAL HIGH (ref 70–99)
Potassium: 4.2 mmol/L (ref 3.5–5.1)
Sodium: 135 mmol/L (ref 135–145)

## 2019-05-20 LAB — HEMOGLOBIN A1C
Hgb A1c MFr Bld: 6.8 % — ABNORMAL HIGH (ref 4.8–5.6)
Mean Plasma Glucose: 148.46 mg/dL

## 2019-05-20 LAB — GLUCOSE, CAPILLARY
Glucose-Capillary: 260 mg/dL — ABNORMAL HIGH (ref 70–99)
Glucose-Capillary: 260 mg/dL — ABNORMAL HIGH (ref 70–99)

## 2019-05-20 LAB — SARS CORONAVIRUS 2 (TAT 6-24 HRS): SARS Coronavirus 2: NEGATIVE

## 2019-05-20 MED ORDER — HYDROMORPHONE HCL 1 MG/ML IJ SOLN
1.0000 mg | Freq: Once | INTRAMUSCULAR | Status: AC
  Administered 2019-05-20: 1 mg via INTRAVENOUS

## 2019-05-20 MED ORDER — MORPHINE SULFATE (PF) 4 MG/ML IV SOLN
4.0000 mg | INTRAVENOUS | Status: DC | PRN
  Administered 2019-05-20 – 2019-05-21 (×3): 4 mg via INTRAVENOUS
  Filled 2019-05-20 (×3): qty 1

## 2019-05-20 MED ORDER — HYDROMORPHONE HCL 1 MG/ML IJ SOLN
INTRAMUSCULAR | Status: AC
  Filled 2019-05-20: qty 1

## 2019-05-20 MED ORDER — SODIUM CHLORIDE 0.9 % IV BOLUS
1000.0000 mL | Freq: Once | INTRAVENOUS | Status: AC
  Administered 2019-05-20: 13:00:00 1000 mL via INTRAVENOUS

## 2019-05-20 MED ORDER — PRAVASTATIN SODIUM 20 MG PO TABS
20.0000 mg | ORAL_TABLET | Freq: Every day | ORAL | Status: DC
  Administered 2019-05-20 – 2019-05-21 (×2): 20 mg via ORAL
  Filled 2019-05-20 (×2): qty 1

## 2019-05-20 MED ORDER — MORPHINE SULFATE (PF) 4 MG/ML IV SOLN
4.0000 mg | Freq: Once | INTRAVENOUS | Status: AC
  Administered 2019-05-20: 4 mg via INTRAVENOUS
  Filled 2019-05-20: qty 1

## 2019-05-20 MED ORDER — MORPHINE SULFATE (PF) 4 MG/ML IV SOLN
INTRAVENOUS | Status: AC
  Filled 2019-05-20: qty 1

## 2019-05-20 MED ORDER — KETOROLAC TROMETHAMINE 30 MG/ML IJ SOLN
15.0000 mg | Freq: Once | INTRAMUSCULAR | Status: AC
  Administered 2019-05-20: 15 mg via INTRAVENOUS
  Filled 2019-05-20: qty 1

## 2019-05-20 MED ORDER — ACETAMINOPHEN 325 MG PO TABS: 650.0000 mg | ORAL_TABLET | Freq: Four times a day (QID) | ORAL | Status: DC | PRN

## 2019-05-20 MED ORDER — ONDANSETRON HCL 4 MG PO TABS: 4.0000 mg | ORAL_TABLET | Freq: Four times a day (QID) | ORAL | Status: DC | PRN

## 2019-05-20 MED ORDER — ACETAMINOPHEN 650 MG RE SUPP: 650.0000 mg | Freq: Four times a day (QID) | RECTAL | Status: DC | PRN

## 2019-05-20 MED ORDER — ONDANSETRON HCL 4 MG/2ML IJ SOLN
4.0000 mg | Freq: Four times a day (QID) | INTRAMUSCULAR | Status: DC | PRN
  Administered 2019-05-21: 08:00:00 4 mg via INTRAVENOUS
  Filled 2019-05-20: qty 2

## 2019-05-20 MED ORDER — TAMSULOSIN HCL 0.4 MG PO CAPS
0.4000 mg | ORAL_CAPSULE | Freq: Every day | ORAL | Status: DC
  Administered 2019-05-21 – 2019-05-22 (×2): 0.4 mg via ORAL
  Filled 2019-05-20 (×2): qty 1

## 2019-05-20 MED ORDER — INSULIN GLARGINE 100 UNIT/ML ~~LOC~~ SOLN
50.0000 [IU] | Freq: Every day | SUBCUTANEOUS | Status: DC
  Administered 2019-05-20 – 2019-05-21 (×2): 50 [IU] via SUBCUTANEOUS
  Filled 2019-05-20 (×3): qty 0.5

## 2019-05-20 MED ORDER — OMEPRAZOLE MAGNESIUM 20 MG PO TBEC: 20.0000 mg | DELAYED_RELEASE_TABLET | Freq: Every day | ORAL | Status: DC

## 2019-05-20 MED ORDER — OXYCODONE HCL 5 MG PO TABS
5.0000 mg | ORAL_TABLET | ORAL | Status: DC | PRN
  Administered 2019-05-21: 08:00:00 5 mg via ORAL
  Filled 2019-05-20: qty 1

## 2019-05-20 MED ORDER — MORPHINE SULFATE (PF) 4 MG/ML IV SOLN
4.0000 mg | Freq: Once | INTRAVENOUS | Status: AC
  Administered 2019-05-20: 4 mg via INTRAVENOUS

## 2019-05-20 MED ORDER — INSULIN ASPART 100 UNIT/ML ~~LOC~~ SOLN
0.0000 [IU] | Freq: Three times a day (TID) | SUBCUTANEOUS | Status: DC
  Administered 2019-05-20: 8 [IU] via SUBCUTANEOUS
  Administered 2019-05-21 – 2019-05-22 (×3): 5 [IU] via SUBCUTANEOUS
  Administered 2019-05-22: 08:00:00 8 [IU] via SUBCUTANEOUS
  Filled 2019-05-20 (×5): qty 1

## 2019-05-20 MED ORDER — LACTATED RINGERS IV SOLN
INTRAVENOUS | Status: DC
  Administered 2019-05-20 – 2019-05-22 (×5): via INTRAVENOUS

## 2019-05-20 MED ORDER — POLYETHYLENE GLYCOL 3350 17 G PO PACK: 17.0000 g | PACK | Freq: Every day | ORAL | Status: DC | PRN

## 2019-05-20 MED ORDER — ONDANSETRON HCL 4 MG/2ML IJ SOLN
4.0000 mg | Freq: Once | INTRAMUSCULAR | Status: AC
  Administered 2019-05-20: 4 mg via INTRAVENOUS
  Filled 2019-05-20: qty 2

## 2019-05-20 MED ORDER — PANTOPRAZOLE SODIUM 40 MG PO TBEC
40.0000 mg | DELAYED_RELEASE_TABLET | Freq: Every day | ORAL | Status: DC
  Administered 2019-05-20 – 2019-05-22 (×3): 40 mg via ORAL
  Filled 2019-05-20 (×3): qty 1

## 2019-05-20 MED ORDER — INSULIN ASPART 100 UNIT/ML ~~LOC~~ SOLN
0.0000 [IU] | Freq: Every day | SUBCUTANEOUS | Status: DC
  Administered 2019-05-20: 22:00:00 3 [IU] via SUBCUTANEOUS
  Administered 2019-05-21 (×2): 2 [IU] via SUBCUTANEOUS
  Filled 2019-05-20 (×2): qty 1

## 2019-05-20 NOTE — ED Notes (Signed)
ED TO INPATIENT HANDOFF REPORT  ED Nurse Name and Phone #: Janett Billow [phone]  S Name/Age/Gender Brandon [name] 63 y.o. male Room/Bed: ED05A/ED05A  Code Status   Code Status: Full Code  Home/SNF/Other Home Patient oriented to: self, place, time and situation Is this baseline? Yes   Triage Complete: Triage complete  Chief Complaint vomiting severe pain  Triage Note Pt c/o L flank pain and vomiting. States had lithotripsy performed Thursday. States pain is worse since procedure. A&O, in wheelchair.    Allergies No Known Allergies  Level of Care/Admitting Diagnosis ED Disposition    ED Disposition Condition [facility]: [place] [100120]  Level of Care: Med-Surg [16]  Covid Evaluation: Asymptomatic Screening Protocol (No Symptoms)  Diagnosis: Nephrolithiasis [790240]  Admitting Physician: Hillary Bow [[id number]]  Attending Physician: Hillary Bow [[id number]]  PT Class (Do Not Modify): Observation [104]  PT Acc Code (Do Not Modify): Observation [10022]       B Medical/Surgery History Past Medical History:  Diagnosis Date  . BPH (benign prostatic hyperplasia)   . Diabetes mellitus without complication ([place])    type 2  . GERD (gastroesophageal reflux disease)   . History of kidney stones    required lithotripsy around 2010, sees Dr. Bernardo Heater   Past Surgical History:  Procedure Laterality Date  . CHOLECYSTECTOMY    . COLONOSCOPY WITH PROPOFOL N/A 04/21/2019   Procedure: COLONOSCOPY WITH PROPOFOL;  Surgeon: Toledo, Benay Pike, MD;  Location: [facility] ENDOSCOPY;  Service: Endoscopy;  Laterality: N/A;  . EXTRACORPOREAL SHOCK WAVE LITHOTRIPSY Left 05/18/2019   Procedure: EXTRACORPOREAL SHOCK WAVE LITHOTRIPSY (ESWL);  Surgeon: Billey Co, MD;  Location: [facility] ORS;  Service: Urology;  Laterality: Left;  . HERNIA REPAIR    . TONSILLECTOMY       A IV Location/Drains/Wounds Patient Lines/Drains/Airways Status   Active  Line/Drains/Airways    Name:   Placement date:   Placement time:   Site:   Days:   Peripheral IV 05/20/19 Left Antecubital   05/20/19    0757    Antecubital   less than 1          Intake/Output Last 24 hours No intake or output data in the 24 hours ending 05/20/19 1402  Labs/Imaging Results for orders placed or performed during the hospital encounter of 05/20/19 (from the past 48 hour(s))  CBC with Differential     Status: None   Collection Time: 05/20/19  8:03 AM  Result Value Ref Range   WBC 7.0 4.0 - 10.5 K/uL   RBC 4.35 4.22 - 5.81 MIL/uL   Hemoglobin 13.9 13.0 - 17.0 g/dL   HCT 39.3 39.0 - 52.0 %   MCV 90.3 80.0 - 100.0 fL   MCH 32.0 26.0 - 34.0 pg   MCHC 35.4 30.0 - 36.0 g/dL   RDW 11.9 11.5 - 15.5 %   Platelets 186 150 - 400 K/uL   nRBC 0.0 0.0 - 0.2 %   Neutrophils Relative % 80 %   Neutro Abs 5.6 1.7 - 7.7 K/uL   Lymphocytes Relative 11 %   Lymphs Abs 0.7 0.7 - 4.0 K/uL   Monocytes Relative 7 %   Monocytes Absolute 0.5 0.1 - 1.0 K/uL   Eosinophils Relative 1 %   Eosinophils Absolute 0.1 0.0 - 0.5 K/uL   Basophils Relative 1 %   Basophils Absolute 0.0 0.0 - 0.1 K/uL   Immature Granulocytes 0 %   Abs Immature Granulocytes 0.03 0.00 -  0.07 K/uL    Comment: Performed at [facility], [address] [place], [place] [address]  Basic metabolic panel     Status: Abnormal   Collection Time: 05/20/19  8:33 AM  Result Value Ref Range   Sodium 135 135 - 145 mmol/L   Potassium 4.2 3.5 - 5.1 mmol/L   Chloride 100 98 - 111 mmol/L   CO2 26 22 - 32 mmol/L   Glucose, Bld 307 (H) 70 - 99 mg/dL   BUN 13 8 - 23 mg/dL   Creatinine, Ser 6.040.91 0.61 - 1.24 mg/dL   Calcium 8.8 (L) 8.9 - 10.3 mg/dL   GFR calc non Af Amer >60 >60 mL/min   GFR calc Af Amer >60 >60 mL/min   Anion gap 9 5 - 15    Comment: Performed at [facility], [address] [place], [place] [address]  Urinalysis, Complete w Microscopic     Status: Abnormal   Collection Time: 05/20/19  9:31  AM  Result Value Ref Range   Color, Urine YELLOW (A) YELLOW   APPearance HAZY (A) CLEAR   Specific Gravity, Urine 1.015 1.005 - 1.030   pH 5.0 5.0 - 8.0   Glucose, UA >=500 (A) NEGATIVE mg/dL   Hgb urine dipstick LARGE (A) NEGATIVE   Bilirubin Urine NEGATIVE NEGATIVE   Ketones, ur 5 (A) NEGATIVE mg/dL   Protein, ur 30 (A) NEGATIVE mg/dL   Nitrite NEGATIVE NEGATIVE   Leukocytes,Ua NEGATIVE NEGATIVE   RBC / HPF >50 (H) 0 - 5 RBC/hpf   WBC, UA 6-10 0 - 5 WBC/hpf   Bacteria, UA RARE (A) NONE SEEN   Squamous Epithelial / LPF 0-5 0 - 5   Mucus PRESENT     Comment: Performed at [facility], [address] [place], [place] [address]   Dg Abdomen 1 View  Result Date: 05/20/2019 CLINICAL DATA:  Status post lithotripsy.  Now with worsening pain EXAM: ABDOMEN - 1 VIEW COMPARISON:  05/18/2019 FINDINGS: The bowel gas pattern is normal. There are 3 calcifications identified within the mid and inferior pole of the left kidney measuring up to 7 mm. Within the proximal left ureter there is a cluster of multiple stones measuring 1.9 cm in length. The largest stone is seen distally measuring 4 mm. IMPRESSION: 1. Residual left mid and inferior pole calculi. Cluster of multiple stones within the proximal left ureter also noted with stones measuring up to 4 mm. . Electronically Signed   By: Signa Kellaylor  Stroud M.D.   On: 05/20/2019 08:42    Pending Labs Unresulted Labs (From admission, onward)    Start     Ordered   05/21/19 0500  Basic metabolic panel  Tomorrow morning,   STAT     05/20/19 1210   05/21/19 0500  CBC  Tomorrow morning,   STAT     05/20/19 1210   05/20/19 1208  Hemoglobin A1c  Once,   STAT    Comments: To assess prior glycemic control    05/20/19 1210   05/20/19 1012  SARS CORONAVIRUS 2 Nasal Swab Aptima Multi Swab  (Asymptomatic/Tier 2 Patients Labs)  Once,   STAT    Question Answer Comment  Is this test for diagnosis or screening Screening   Symptomatic for COVID-19 as defined  by CDC No   Hospitalized for COVID-19 No   Admitted to ICU for COVID-19 No   Previously tested for COVID-19 Yes   Resident in a congregate (group) care setting No  Employed in healthcare setting No      05/20/19 1011          Vitals/Pain Today's Vitals   05/20/19 1030 05/20/19 1101 05/20/19 1133 05/20/19 1201  BP: 128/73  127/62 112/84  Pulse: 98 94 88 92  Resp:      Temp:      TempSrc:      SpO2: 91% 97% 94% 96%  Weight:      Height:      PainSc:        Isolation Precautions No active isolations  Medications Medications  lactated ringers infusion (has no administration in time range)  acetaminophen (TYLENOL) tablet 650 mg (has no administration in time range)    Or  acetaminophen (TYLENOL) suppository 650 mg (has no administration in time range)  polyethylene glycol (MIRALAX / GLYCOLAX) packet 17 g (has no administration in time range)  ondansetron (ZOFRAN) tablet 4 mg (has no administration in time range)    Or  ondansetron (ZOFRAN) injection 4 mg (has no administration in time range)  oxyCODONE (Oxy IR/ROXICODONE) immediate release tablet 5 mg (has no administration in time range)  insulin aspart (novoLOG) injection 0-5 Units (has no administration in time range)  insulin aspart (novoLOG) injection 0-15 Units (has no administration in time range)  morphine 4 MG/ML injection 4 mg (has no administration in time range)  tamsulosin (FLOMAX) capsule 0.4 mg (has no administration in time range)  morphine 4 MG/ML injection 4 mg (4 mg Intravenous Given 05/20/19 0758)  ondansetron (ZOFRAN) injection 4 mg (4 mg Intravenous Given 05/20/19 0757)  morphine 4 MG/ML injection 4 mg (4 mg Intravenous Given 05/20/19 0839)  HYDROmorphone (DILAUDID) injection 1 mg (1 mg Intravenous Given 05/20/19 0913)  ketorolac (TORADOL) 30 MG/ML injection 15 mg (15 mg Intravenous Given 05/20/19 1238)  sodium chloride 0.9 % bolus 1,000 mL (1,000 mLs Intravenous New Bag/Given 05/20/19 1247)     Mobility walks with person assist Low fall risk   Focused Assessments Renal Assessment Handoff:  Ureterolithiasis       R Recommendations: See Admitting Provider Note  Report given to:   Additional Notes:

## 2019-05-20 NOTE — ED Notes (Signed)
Per Dr Darvin Neighbours- pt given [place] sandwich tray and water to drink

## 2019-05-20 NOTE — ED Notes (Signed)
Pt given urinal for urine sample 

## 2019-05-20 NOTE — ED Notes (Signed)
Pt had lithotripsy Thursday for a kidney stone which pt thinks he passed parts of- pt still having left flank pain

## 2019-05-20 NOTE — ED Provider Notes (Signed)
[facility] Emergency Department Provider Note   ____________________________________________    I have reviewed the triage vital signs and the nursing notes.   HISTORY  Chief Complaint Flank Pain     HPI Brandon [name] is a 63 y.o. male with history of diabetes, diagnosed with a kidney stone on August 10, had lithotripsy on August 13, reportedly 9 mm stone, had immediate pain after lithotripsy seem to improve somewhat however last night pain became severe, intolerable.  Patient reports compliance with medications.  No fevers or chills.  Sees Dr. Irish EldersSiminski of urology  Past Medical History:  Diagnosis Date  . BPH (benign prostatic hyperplasia)   . Diabetes mellitus without complication (HCC)    type 2  . GERD (gastroesophageal reflux disease)   . History of kidney stones    required lithotripsy around 2010, sees Dr. Lonna CobbStoioff    Patient Active Problem List   Diagnosis Date Noted  . Ureteral calculus, left 05/16/2019  . Hydronephrosis with urinary obstruction due to ureteral calculus 05/16/2019  . Nephrolithiasis 05/16/2019  . Valvular insufficiency 05/15/2019  . Hypoglycemia due to insulin 09/20/2018  . Diabetes (HCC) 09/20/2018  . HTN (hypertension) 09/20/2018  . GERD (gastroesophageal reflux disease) 09/20/2018  . Obesity (BMI 30.0-34.9) 04/12/2017  . Vaccine counseling 02/11/2016  . Pure hypercholesterolemia 04/05/2015    Past Surgical History:  Procedure Laterality Date  . CHOLECYSTECTOMY    . COLONOSCOPY WITH PROPOFOL N/A 04/21/2019   Procedure: COLONOSCOPY WITH PROPOFOL;  Surgeon: Toledo, Boykin Nearingeodoro K, MD;  Location: [facility] ENDOSCOPY;  Service: Endoscopy;  Laterality: N/A;  . EXTRACORPOREAL SHOCK WAVE LITHOTRIPSY Left 05/18/2019   Procedure: EXTRACORPOREAL SHOCK WAVE LITHOTRIPSY (ESWL);  Surgeon: Sondra ComeSninsky, Brian C, MD;  Location: [facility] ORS;  Service: Urology;  Laterality: Left;  . HERNIA REPAIR    . TONSILLECTOMY      Prior to Admission  medications   Medication Sig Start Date End Date Taking? Authorizing Provider  Ascorbic Acid (VITAMIN C) 1000 MG tablet Take 1,000 mg by mouth daily.   Yes [provider]  aspirin EC 81 MG tablet Take 81 mg by mouth daily.   Yes [provider]  glucose blood (ONETOUCH ULTRA) test strip Use 4 (four) times daily 04/20/19  Yes [provider]  Insulin Glargine (LANTUS SOLOSTAR) 100 UNIT/ML Solostar Pen Inject 50 Units into the skin at bedtime. 06/14/18  Yes [provider]  insulin lispro (HUMALOG KWIKPEN) 100 UNIT/ML KwikPen Inject 8-10 Units into the skin 3 (three) times daily with meals. 06/17/18 06/17/19 Yes [provider]  Lancets (ONETOUCH DELICA PLUS LANCET33G) MISC Take by mouth. 04/20/19  Yes [provider]  lisinopril (PRINIVIL,ZESTRIL) 5 MG tablet Take 5 mg by mouth daily. 06/17/18  Yes [provider]  metFORMIN (GLUCOPHAGE) 500 MG tablet Take 500 mg by mouth 2 (two) times daily. 05/12/18  Yes [provider]  Multiple Vitamin (MULTI-VITAMINS) TABS Take 1 tablet by mouth daily.   Yes [provider]  omeprazole (PRILOSEC OTC) 20 MG tablet Take 20 mg by mouth daily.    Yes [provider]  pravastatin (PRAVACHOL) 20 MG tablet Take 20 mg by mouth at bedtime. 05/12/18  Yes [provider]     Allergies Patient has no known allergies.  Family History  Problem Relation Age of Onset  . Diabetes Mother   . Diabetes Father     Social History Social History   Tobacco Use  . Smoking status: Never Smoker  . Smokeless  tobacco: Never Used  Substance Use Topics  . Alcohol use: Never    Frequency: Never  . Drug use: Never    Review of Systems  Constitutional: No fever/chills Eyes: No visual changes.  ENT: No sore throat. Cardiovascular: Denies chest pain. Respiratory: Denies shortness of breath. Gastrointestinal: As above Genitourinary: Passing small stones Musculoskeletal: Negative for  back pain. Skin: Negative for rash. Neurological: Negative for headaches or weakness   ____________________________________________   PHYSICAL EXAM:  VITAL SIGNS: ED Triage Vitals  Enc Vitals Group     BP 05/20/19 0737 (!) 155/99     Pulse Rate 05/20/19 0737 95     Resp 05/20/19 0737 18     Temp 05/20/19 0737 97.8 F (36.6 C)     Temp Source 05/20/19 0737 Oral     SpO2 05/20/19 0737 96 %     Weight 05/20/19 0738 103.4 kg (228 lb)     Height 05/20/19 0738 1.803 m (5\' 11" )     Head Circumference --      Peak Flow --      Pain Score 05/20/19 0738 10     Pain Loc --      Pain Edu? --      Excl. in [place]? --     Constitutional: Alert and oriented.    Mouth/Throat: Mucous membranes are moist.    Cardiovascular: Normal rate, regular rhythm. Good peripheral circulation. Respiratory: Normal respiratory effort.  No retractions. Gastrointestinal: Soft nontender, no distention  Musculoskeletal:   Warm and well perfused Neurologic:  Normal speech and language. No gross focal neurologic deficits are appreciated.  Skin:  Skin is warm, dry and intact. No rash noted. Psychiatric: Mood and affect are normal. Speech and behavior are normal.  ____________________________________________   LABS (all labs ordered are listed, but only abnormal results are displayed)  Labs Reviewed  URINALYSIS, COMPLETE (UACMP) WITH MICROSCOPIC - Abnormal; Notable for the following components:      Result Value   Color, Urine YELLOW (*)    APPearance HAZY (*)    Glucose, UA >=500 (*)    Hgb urine dipstick LARGE (*)    Ketones, ur 5 (*)    Protein, ur 30 (*)    RBC / HPF >50 (*)    Bacteria, UA RARE (*)    All other components within normal limits  BASIC METABOLIC PANEL - Abnormal; Notable for the following components:   Glucose, Bld 307 (*)    Calcium 8.8 (*)    All other components within normal limits  SARS CORONAVIRUS 2  CBC WITH DIFFERENTIAL/PLATELET  HEMOGLOBIN A1C    ____________________________________________  EKG  None ____________________________________________  RADIOLOGY  Multiple stones proximal ureter ____________________________________________   PROCEDURES  Procedure(s) performed: No  Procedures   Critical Care performed: No ____________________________________________   INITIAL IMPRESSION / ASSESSMENT AND PLAN / ED COURSE  Pertinent labs & imaging results that were available during my care of the patient were reviewed by me and considered in my medical decision making (see chart for details).  Patient presents with left flank pain status post lithotripsy, likely related to ureterolithiasis.  We will give IV morphine, IV Zofran, check labs, urinalysis and discuss with urology  Additional dose of morphine given  Dilaudid given for continued pain with significant relief  Urology advises that it is okay to give Toradol at this time  Will admit to medicine for pain control    ____________________________________________   FINAL CLINICAL IMPRESSION(S) / ED DIAGNOSES  Final diagnoses:  Ureterolithiasis        Note:  This document was prepared using Dragon voice recognition software and may include unintentional dictation errors.   Jene EveryKinner, Yomaira Solar, MD 05/20/19 1227

## 2019-05-20 NOTE — H&P (Signed)
[employer] [facility] [place] [facility]   PATIENT NAME: Brandon [name]    MR#:  [id number]  DATE OF BIRTH:  07/29/1956  DATE OF ADMISSION:  05/20/2019  PRIMARY CARE PHYSICIAN: Marisue IvanLinthavong, Kanhka, MD   REQUESTING/REFERRING PHYSICIAN: Dr. Cyril LoosenKinner  CHIEF COMPLAINT:   Chief Complaint  Patient presents with  . Flank Pain    HISTORY OF PRESENT ILLNESS:  Brandon Droneoby Dascenzo  is a 63 y.o. male with a known history of diabetes mellitus, BPH, nephrolithiasis who was diagnosed with left ureteral stones on 05/15/2019 and had lithotripsy on 05/18/2019 presents today with intractable left flank pain.  He has received multiple doses of Toradol and morphine and still in pain.  He has also noticed hematuria.  Did not see any stones being passed.  Patient is being admitted for pain control.  An x-ray showed multiple ureteral stones of up to 4 mm.  PAST MEDICAL HISTORY:   Past Medical History:  Diagnosis Date  . BPH (benign prostatic hyperplasia)   . Diabetes mellitus without complication (HCC)    type 2  . GERD (gastroesophageal reflux disease)   . History of kidney stones    required lithotripsy around 2010, sees Dr. Lonna CobbStoioff    PAST SURGICAL HISTORY:   Past Surgical History:  Procedure Laterality Date  . CHOLECYSTECTOMY    . COLONOSCOPY WITH PROPOFOL N/A 04/21/2019   Procedure: COLONOSCOPY WITH PROPOFOL;  Surgeon: Toledo, Boykin Nearingeodoro K, MD;  Location: [facility] ENDOSCOPY;  Service: Endoscopy;  Laterality: N/A;  . EXTRACORPOREAL SHOCK WAVE LITHOTRIPSY Left 05/18/2019   Procedure: EXTRACORPOREAL SHOCK WAVE LITHOTRIPSY (ESWL);  Surgeon: Sondra ComeSninsky, Brian C, MD;  Location: [facility] ORS;  Service: Urology;  Laterality: Left;  . HERNIA REPAIR    . TONSILLECTOMY      SOCIAL HISTORY:   Social History   Tobacco Use  . Smoking status: Never Smoker  . Smokeless tobacco: Never Used  Substance Use Topics  . Alcohol use: Never    Frequency: Never    FAMILY HISTORY:   Family History  Problem Relation Age of  Onset  . Diabetes Mother   . Diabetes Father     DRUG ALLERGIES:  No Known Allergies  REVIEW OF SYSTEMS:   Review of Systems  Constitutional: Negative for chills and fever.  HENT: Negative for sore throat.   Eyes: Negative for blurred vision, double vision and pain.  Respiratory: Negative for cough, hemoptysis, shortness of breath and wheezing.   Cardiovascular: Negative for chest pain, palpitations, orthopnea and leg swelling.  Gastrointestinal: Negative for abdominal pain, constipation, diarrhea, heartburn, nausea and vomiting.  Genitourinary: Negative for dysuria and hematuria.  Musculoskeletal: Negative for back pain and joint pain.  Skin: Negative for rash.  Neurological: Negative for sensory change, speech change, focal weakness and headaches.  Endo/Heme/Allergies: Does not bruise/bleed easily.  Psychiatric/Behavioral: Negative for depression. The patient is not nervous/anxious.    MEDICATIONS AT HOME:   Prior to Admission medications   Medication Sig Start Date End Date Taking? Authorizing Provider  Ascorbic Acid (VITAMIN C) 1000 MG tablet Take 1,000 mg by mouth daily.   Yes [provider]  aspirin EC 81 MG tablet Take 81 mg by mouth daily.   Yes [provider]  glucose blood (ONETOUCH ULTRA) test strip Use 4 (four) times daily 04/20/19  Yes [provider]  Insulin Glargine (LANTUS SOLOSTAR) 100 UNIT/ML Solostar Pen Inject 50 Units into the skin at bedtime. 06/14/18  Yes [provider]  insulin lispro (HUMALOG KWIKPEN) 100 UNIT/ML  KwikPen Inject 8-10 Units into the skin 3 (three) times daily with meals. 06/17/18 06/17/19 Yes [provider]  Lancets (ONETOUCH DELICA PLUS [id number]) [place] Take by mouth. 04/20/19  Yes [provider]  lisinopril (PRINIVIL,ZESTRIL) 5 MG tablet Take 5 mg by mouth daily. 06/17/18  Yes [provider]  metFORMIN (GLUCOPHAGE) 500 MG tablet Take 500 mg by mouth 2 (two) times daily. 05/12/18   Yes [provider]  Multiple Vitamin (MULTI-VITAMINS) TABS Take 1 tablet by mouth daily.   Yes [provider]  omeprazole (PRILOSEC OTC) 20 MG tablet Take 20 mg by mouth daily.    Yes [provider]  pravastatin (PRAVACHOL) 20 MG tablet Take 20 mg by mouth at bedtime. 05/12/18  Yes [provider]     VITAL SIGNS:  Blood pressure 112/84, pulse 92, temperature 97.8 F (36.6 C), temperature source Oral, resp. rate 18, height 5\' 11"  (1.803 m), weight 103.4 kg, SpO2 96 %.  PHYSICAL EXAMINATION:  Physical Exam  GENERAL:  63 y.o.-year-old patient lying in the bed with no acute distress.  EYES: Pupils equal, round, reactive to light and accommodation. No scleral icterus. Extraocular muscles intact.  HEENT: Head atraumatic, normocephalic. Oropharynx and nasopharynx clear. No oropharyngeal erythema, moist oral mucosa.  NECK:  Supple, no jugular venous distention. No thyroid enlargement, no tenderness.  LUNGS: Normal breath sounds bilaterally, no wheezing, rales, rhonchi. No use of accessory muscles of respiration.  CARDIOVASCULAR: S1, S2 normal. No murmurs, rubs, or gallops.  ABDOMEN: Soft, left-sided abdominal tenderness, nondistended. Bowel sounds present. No organomegaly or mass.  EXTREMITIES: No pedal edema, cyanosis, or clubbing. + 2 pedal & radial pulses b/l.   NEUROLOGIC: Cranial nerves II through XII are intact. No focal Motor or sensory deficits appreciated b/l. PSYCHIATRIC: The patient is alert and oriented x 3. Good affect.  SKIN: No obvious rash, lesion, or ulcer.   LABORATORY PANEL:   CBC Recent Labs  Lab 05/20/19 0803  WBC 7.0  HGB 13.9  HCT 39.3  PLT 186   ------------------------------------------------------------------------------------------------------------------  Chemistries  Recent Labs  Lab 05/20/19 0833  NA 135  K 4.2  CL 100  CO2 26  GLUCOSE 307*  BUN 13  CREATININE 0.91  CALCIUM 8.8*    ------------------------------------------------------------------------------------------------------------------  Cardiac Enzymes No results for input(s): TROPONINI in the last 168 hours. ------------------------------------------------------------------------------------------------------------------  RADIOLOGY:  Dg Abdomen 1 View  Result Date: 05/20/2019 CLINICAL DATA:  Status post lithotripsy.  Now with worsening pain EXAM: ABDOMEN - 1 VIEW COMPARISON:  05/18/2019 FINDINGS: The bowel gas pattern is normal. There are 3 calcifications identified within the mid and inferior pole of the left kidney measuring up to 7 mm. Within the proximal left ureter there is a cluster of multiple stones measuring 1.9 cm in length. The largest stone is seen distally measuring 4 mm. IMPRESSION: 1. Residual left mid and inferior pole calculi. Cluster of multiple stones within the proximal left ureter also noted with stones measuring up to 4 mm. . Electronically Signed   By: Kerby Moors M.D.   On: 05/20/2019 08:42   IMPRESSION AND PLAN:   *Left ureteral stones.  Up to 4 mm.  Admit patient to medical floor and start IV fluids.  Ordered Flomax.  Strain urine.    *Diabetes mellitus. Continue Lantus.  Sliding scale insulin ordered.  *Hypertension.  Blood pressure in the normal range.  Hold lisinopril at this time.  DVT prophylaxis with SCDs.  No Lovenox or heparin secondary to hematuria  All the  records are reviewed and case discussed with ED provider. Management plans discussed with the patient, family and they are in agreement.  CODE STATUS: FULL CODE  TOTAL TIME TAKING CARE OF THIS PATIENT: 40 minutes.   Molinda BailiffSrikar R Desmon Hitchner M.D on 05/20/2019 at 12:11 PM  Between 7am to 6pm - Pager - [phone]  After 6pm go to [email or web address] - password [email or web address] [facility]  [facility]  Office  [phone]  CC: Primary care physician; Marisue IvanLinthavong, Kanhka, MD  Note: This dictation was prepared with  Dragon dictation along with smaller phrase technology. Any transcriptional errors that result from this process are unintentional.

## 2019-05-20 NOTE — ED Notes (Signed)
Pt given pillow.

## 2019-05-20 NOTE — Progress Notes (Signed)
Advance care planning  Purpose of Encounter nephrolithiasis, hematuria  Parties in Attendance Patient  Patients Decisional capacity Alert and oriented.  Able to make medical decisions.  Wife is medical power of attorney  No ACP documents in place  Discussed in detail regarding nephrolithiasis, hematuria.  Treatment plan , prognosis discussed.  All questions answered  CODE STATUS discussed and patient tells me he has never given it a thought.  At this time he requests aggressive medical care with intubation/defibrillation/CPR if needed  Orders entered and CODE STATUS changed  FULL CODE  Time spent - 17 minutes

## 2019-05-20 NOTE — ED Notes (Signed)
Patient transported to X-ray 

## 2019-05-20 NOTE — ED Notes (Addendum)
Dr. Sudini at bedside.  

## 2019-05-20 NOTE — ED Triage Notes (Signed)
Pt c/o L flank pain and vomiting. States had lithotripsy performed Thursday. States pain is worse since procedure. A&O, in wheelchair.

## 2019-05-20 NOTE — ED Notes (Addendum)
Dr Kinner at bedside. 

## 2019-05-21 ENCOUNTER — Inpatient Hospital Stay: Payer: Managed Care, Other (non HMO) | Admitting: Anesthesiology

## 2019-05-21 ENCOUNTER — Encounter
Admission: EM | Disposition: A | Discharge status: Home / Self Care | Payer: Self-pay | Source: Home / Self Care | Attending: Internal Medicine

## 2019-05-21 ENCOUNTER — Encounter: Payer: Self-pay | Admitting: Urology

## 2019-05-21 ENCOUNTER — Inpatient Hospital Stay: Payer: Managed Care, Other (non HMO)

## 2019-05-21 DIAGNOSIS — Z79899 Other long term (current) drug therapy: Secondary | ICD-10-CM | POA: Diagnosis not present

## 2019-05-21 DIAGNOSIS — E7800 Pure hypercholesterolemia, unspecified: Secondary | ICD-10-CM | POA: Diagnosis present

## 2019-05-21 DIAGNOSIS — I10 Essential (primary) hypertension: Secondary | ICD-10-CM | POA: Diagnosis present

## 2019-05-21 DIAGNOSIS — Z20828 Contact with and (suspected) exposure to other viral communicable diseases: Secondary | ICD-10-CM | POA: Diagnosis present

## 2019-05-21 DIAGNOSIS — R109 Unspecified abdominal pain: Secondary | ICD-10-CM | POA: Diagnosis present

## 2019-05-21 DIAGNOSIS — Z7982 Long term (current) use of aspirin: Secondary | ICD-10-CM | POA: Diagnosis not present

## 2019-05-21 DIAGNOSIS — N3941 Urge incontinence: Secondary | ICD-10-CM | POA: Diagnosis not present

## 2019-05-21 DIAGNOSIS — E119 Type 2 diabetes mellitus without complications: Secondary | ICD-10-CM | POA: Diagnosis present

## 2019-05-21 DIAGNOSIS — N202 Calculus of kidney with calculus of ureter: Secondary | ICD-10-CM | POA: Diagnosis present

## 2019-05-21 DIAGNOSIS — Z87442 Personal history of urinary calculi: Secondary | ICD-10-CM | POA: Diagnosis not present

## 2019-05-21 DIAGNOSIS — Z794 Long term (current) use of insulin: Secondary | ICD-10-CM | POA: Diagnosis not present

## 2019-05-21 DIAGNOSIS — K219 Gastro-esophageal reflux disease without esophagitis: Secondary | ICD-10-CM | POA: Diagnosis present

## 2019-05-21 DIAGNOSIS — N401 Enlarged prostate with lower urinary tract symptoms: Secondary | ICD-10-CM | POA: Diagnosis present

## 2019-05-21 DIAGNOSIS — N201 Calculus of ureter: Secondary | ICD-10-CM | POA: Diagnosis present

## 2019-05-21 HISTORY — PX: CYSTOSCOPY W/ URETERAL STENT PLACEMENT: SHX1429

## 2019-05-21 LAB — BASIC METABOLIC PANEL
Anion gap: 6 (ref 5–15)
BUN: 11 mg/dL (ref 8–23)
CO2: 25 mmol/L (ref 22–32)
Calcium: 8.3 mg/dL — ABNORMAL LOW (ref 8.9–10.3)
Chloride: 103 mmol/L (ref 98–111)
Creatinine, Ser: 0.81 mg/dL (ref 0.61–1.24)
GFR calc Af Amer: >60 mL/min (ref >60)
GFR calc non Af Amer: >60 mL/min (ref >60)
Glucose, Bld: 187 mg/dL — ABNORMAL HIGH (ref 70–99)
Potassium: 3.7 mmol/L (ref 3.5–5.1)
Sodium: 134 mmol/L — ABNORMAL LOW (ref 135–145)

## 2019-05-21 LAB — GLUCOSE, CAPILLARY
Glucose-Capillary: 232 mg/dL — ABNORMAL HIGH (ref 70–99)
Glucose-Capillary: 210 mg/dL — ABNORMAL HIGH (ref 70–99)
Glucose-Capillary: 202 mg/dL — ABNORMAL HIGH (ref 70–99)
Glucose-Capillary: 212 mg/dL — ABNORMAL HIGH (ref 70–99)
Glucose-Capillary: 234 mg/dL — ABNORMAL HIGH (ref 70–99)

## 2019-05-21 LAB — CBC
HCT: 32.5 % — ABNORMAL LOW (ref 39.0–52.0)
Hemoglobin: 11.6 g/dL — ABNORMAL LOW (ref 13.0–17.0)
MCH: 31.8 pg (ref 26.0–34.0)
MCHC: 35.7 g/dL (ref 30.0–36.0)
MCV: 89 fL (ref 80.0–100.0)
Platelets: 157 10*3/uL (ref 150–400)
RBC: 3.65 MIL/uL — ABNORMAL LOW (ref 4.22–5.81)
RDW: 11.9 % (ref 11.5–15.5)
WBC: 5.1 10*3/uL (ref 4.0–10.5)
nRBC: 0 % (ref 0.0–0.2)

## 2019-05-21 SURGERY — CYSTOSCOPY, WITH RETROGRADE PYELOGRAM AND URETERAL STENT INSERTION
Anesthesia: General | Laterality: Left

## 2019-05-21 MED ORDER — PROPOFOL 10 MG/ML IV BOLUS
INTRAVENOUS | Status: AC
  Filled 2019-05-21: qty 40

## 2019-05-21 MED ORDER — LISINOPRIL 10 MG PO TABS
10.0000 mg | ORAL_TABLET | Freq: Every day | ORAL | Status: DC
  Administered 2019-05-21 – 2019-05-22 (×2): 10 mg via ORAL
  Filled 2019-05-21 (×2): qty 1

## 2019-05-21 MED ORDER — MORPHINE SULFATE (PF) 4 MG/ML IV SOLN
4.0000 mg | INTRAVENOUS | Status: AC
  Administered 2019-05-21: 4 mg via INTRAVENOUS
  Filled 2019-05-21: qty 1

## 2019-05-21 MED ORDER — ACETAMINOPHEN 160 MG/5ML PO SOLN
325.0000 mg | ORAL | Status: DC | PRN
  Filled 2019-05-21: qty 20.3

## 2019-05-21 MED ORDER — DOCUSATE SODIUM 100 MG PO CAPS
100.0000 mg | ORAL_CAPSULE | Freq: Two times a day (BID) | ORAL | Status: DC | PRN
  Administered 2019-05-21 (×2): 100 mg via ORAL
  Filled 2019-05-21 (×2): qty 1

## 2019-05-21 MED ORDER — MEPERIDINE HCL 50 MG/ML IJ SOLN: 6.2500 mg | INTRAMUSCULAR | Status: DC | PRN

## 2019-05-21 MED ORDER — LIDOCAINE HCL (CARDIAC) PF 100 MG/5ML IV SOSY
PREFILLED_SYRINGE | INTRAVENOUS | Status: DC | PRN
  Administered 2019-05-21: 100 mg via INTRAVENOUS

## 2019-05-21 MED ORDER — LIDOCAINE HCL (PF) 2 % IJ SOLN
INTRAMUSCULAR | Status: AC
  Filled 2019-05-21: qty 10

## 2019-05-21 MED ORDER — FENTANYL CITRATE (PF) 100 MCG/2ML IJ SOLN
INTRAMUSCULAR | Status: AC
  Filled 2019-05-21: qty 2

## 2019-05-21 MED ORDER — PHENYLEPHRINE HCL (PRESSORS) 10 MG/ML IV SOLN
INTRAVENOUS | Status: DC | PRN
  Administered 2019-05-21: 100 ug via INTRAVENOUS

## 2019-05-21 MED ORDER — PHENYLEPHRINE HCL (PRESSORS) 10 MG/ML IV SOLN
INTRAVENOUS | Status: AC
  Filled 2019-05-21: qty 1

## 2019-05-21 MED ORDER — ONDANSETRON HCL 4 MG/2ML IJ SOLN
INTRAMUSCULAR | Status: DC | PRN
  Administered 2019-05-21: 4 mg via INTRAVENOUS

## 2019-05-21 MED ORDER — INSULIN ASPART 100 UNIT/ML ~~LOC~~ SOLN
SUBCUTANEOUS | Status: AC
  Filled 2019-05-21: qty 1

## 2019-05-21 MED ORDER — FENTANYL CITRATE (PF) 100 MCG/2ML IJ SOLN
INTRAMUSCULAR | Status: DC | PRN
  Administered 2019-05-21 (×2): 50 ug via INTRAVENOUS

## 2019-05-21 MED ORDER — PROPOFOL 10 MG/ML IV BOLUS
INTRAVENOUS | Status: DC | PRN
  Administered 2019-05-21: 170 mg via INTRAVENOUS

## 2019-05-21 MED ORDER — PROMETHAZINE HCL 25 MG/ML IJ SOLN: 6.2500 mg | INTRAMUSCULAR | Status: DC | PRN

## 2019-05-21 MED ORDER — FENTANYL CITRATE (PF) 100 MCG/2ML IJ SOLN: 25.0000 ug | INTRAMUSCULAR | Status: DC | PRN

## 2019-05-21 MED ORDER — CEFAZOLIN SODIUM-DEXTROSE 2-4 GM/100ML-% IV SOLN
2.0000 g | Freq: Once | INTRAVENOUS | Status: AC
  Administered 2019-05-21: 2 g via INTRAVENOUS
  Filled 2019-05-21: qty 100

## 2019-05-21 MED ORDER — ONDANSETRON HCL 4 MG/2ML IJ SOLN
INTRAMUSCULAR | Status: AC
  Filled 2019-05-21: qty 2

## 2019-05-21 MED ORDER — ACETAMINOPHEN 325 MG PO TABS: 325.0000 mg | ORAL_TABLET | ORAL | Status: DC | PRN

## 2019-05-21 SURGICAL SUPPLY — 16 items
BAG DRAIN CYSTO-URO LG1000N (MISCELLANEOUS) ×3 IMPLANT
CATH URETL 5X70 OPEN END (CATHETERS) ×3 IMPLANT
GLOVE BIO SURGEON STRL SZ8 (GLOVE) ×9 IMPLANT
GOWN STRL REUS W/ TWL LRG LVL4 (GOWN DISPOSABLE) ×1 IMPLANT
GOWN STRL REUS W/ TWL XL LVL3 (GOWN DISPOSABLE) ×1 IMPLANT
GOWN STRL REUS W/TWL LRG LVL4 (GOWN DISPOSABLE) ×2
GOWN STRL REUS W/TWL XL LVL3 (GOWN DISPOSABLE) ×2
GUIDEWIRE STR DUAL SENSOR (WIRE) ×3 IMPLANT
KIT TURNOVER CYSTO (KITS) ×3 IMPLANT
PACK CYSTO AR (MISCELLANEOUS) ×3 IMPLANT
SET CYSTO W/LG BORE CLAMP LF (SET/KITS/TRAYS/PACK) ×3 IMPLANT
SOL .9 NS 3000ML IRR  AL (IV SOLUTION) ×2
SOL .9 NS 3000ML IRR UROMATIC (IV SOLUTION) ×1 IMPLANT
STENT URET 6FRX24 CONTOUR (STENTS) IMPLANT
STENT URET 6FRX26 CONTOUR (STENTS) ×3 IMPLANT
WATER STERILE IRR 1000ML POUR (IV SOLUTION) ×3 IMPLANT

## 2019-05-21 NOTE — Discharge Instructions (Signed)

## 2019-05-21 NOTE — Anesthesia Preprocedure Evaluation (Addendum)
Anesthesia Evaluation  Patient identified by MRN, date of birth, ID band Patient awake  General Assessment Comment:Pt had small amount of fruit at 12 noon, will need to wait until 1800 (6hrs) to start procedure.  Reviewed: Allergy & Precautions, H&P , NPO status , reviewed documented beta blocker date and time   Airway Mallampati: II  TM Distance: >3 FB Neck ROM: full    Dental  (+) Teeth Intact   Pulmonary    Pulmonary exam normal        Cardiovascular hypertension, Normal cardiovascular exam     Neuro/Psych    GI/Hepatic GERD  Medicated and Controlled,Mild nausea, no vomiting   Endo/Other  diabetes  Renal/GU Renal disease     Musculoskeletal   Abdominal   Peds  Hematology   Anesthesia Other Findings Past Medical History: No date: BPH (benign prostatic hyperplasia) No date: Diabetes mellitus without complication (HCC)     Comment:  type 2 No date: GERD (gastroesophageal reflux disease) No date: History of kidney stones     Comment:  required lithotripsy around 2010, sees Dr. Bernardo Heater Past Surgical History: No date: CHOLECYSTECTOMY 04/21/2019: COLONOSCOPY WITH PROPOFOL; N/A     Comment:  Procedure: COLONOSCOPY WITH PROPOFOL;  Surgeon: Toledo,               Benay Pike, MD;  Location: [facility] ENDOSCOPY;  Service:               Endoscopy;  Laterality: N/A; 05/18/2019: EXTRACORPOREAL SHOCK WAVE LITHOTRIPSY; Left     Comment:  Procedure: EXTRACORPOREAL SHOCK WAVE LITHOTRIPSY (ESWL);              Surgeon: Billey Co, MD;  Location: [facility] ORS;                Service: Urology;  Laterality: Left; No date: HERNIA REPAIR No date: TONSILLECTOMY BMI    Body Mass Index: 31.55 kg/m     Reproductive/Obstetrics                            Anesthesia Physical Anesthesia Plan  ASA: III and emergent  Anesthesia Plan: General ETT and General LMA   Post-op Pain Management:    Induction:  Intravenous  PONV Risk Score and Plan: Ondansetron, Treatment may vary due to age or medical condition and Metaclopromide  Airway Management Planned: LMA and Oral ETT  Additional Equipment:   Intra-op Plan:   Post-operative Plan: Extubation in OR  Informed Consent: I have reviewed the patients History and Physical, chart, labs and discussed the procedure including the risks, benefits and alternatives for the proposed anesthesia with the patient or authorized representative who has indicated his/her understanding and acceptance.     Dental Advisory Given  Plan Discussed with: CRNA  Anesthesia Plan Comments:        Anesthesia Quick Evaluation

## 2019-05-21 NOTE — Progress Notes (Signed)
Patient continues to have significant pain after receiving IV and oral pain med. Dr sudini notified and ordered a once repeat dose of morphine

## 2019-05-21 NOTE — Progress Notes (Signed)
I was called to see Brandon [name] for intractable pain following left ESWL last week.   A CT today shows multiple stones in the proximal ureter with obstruction.   I have reviewed his prior history and I have called and spoken to him.    I discussed the options for management including placement of a percutaneous nephrostomy tube vs ureteral stent insertion.   I am going to get him set up for the stent insertion today and reviewed the risks of bleeding, infection, ureteral injury, need for secondary procedures, thrombotic events and anesthetic complications.     I will see him in the holding area preoperatively to complete my assessment.

## 2019-05-21 NOTE — Anesthesia Procedure Notes (Signed)
Procedure Name: LMA Insertion Date/Time: 05/21/2019 6:04 PM Performed by: Chanetta Marshall, CRNA Pre-anesthesia Checklist: Patient identified, Emergency Drugs available, Suction available and Patient being monitored Patient Re-evaluated:Patient Re-evaluated prior to induction Oxygen Delivery Method: Circle system utilized Preoxygenation: Pre-oxygenation with 100% oxygen Induction Type: IV induction Ventilation: Mask ventilation without difficulty LMA: LMA inserted LMA Size: 4.0 Tube type: Oral Number of attempts: 1 Placement Confirmation: positive ETCO2,  breath sounds checked- equal and bilateral and CO2 detector Tube secured with: Tape Dental Injury: Teeth and Oropharynx as per pre-operative assessment

## 2019-05-21 NOTE — Op Note (Addendum)
Procedure: Cystoscopy with insertion of left double-J stent.  Preop diagnosis: Ureteral stones with obstruction and intractable pain.  Postop diagnosis: Same.  Surgeon: Dr. Irine Seal.  Anesthesia: General.  Specimen: None.  Drain: 6 [place] by 26 cm contour double-J stent without tether.  EBL: None.  Complications: None.  Indications: Brandon [name] is a 63 year old male who had left lithotripsy on 8/13.  He presented to the emergency room yesterday with severe left flank pain that was poorly controlled with narcotics and Toradol.  A KUB showed multiple fragments in the left proximal ureter and 2 stones in the kidney.  He was admitted to the medical service and I was consulted this afternoon when his pain had not abated.  A CT scan demonstrated residual fragments in the left proximal ureter with the largest approximately 3 x 10 mm and there were 2 stones in the left kidney as well.  After reviewing the options it was felt that stenting was indicated.  Procedure: He was given 2 g of Ancef.  He was taken the operating room where general anesthetic was induced.  He was placed in lithotomy position.  He was prepped with Betadine and draped in usual sterile fashion.  Cystoscopy was performed with the 23 [place] scope and 30 degree lens.  Examination revealed a normal urethra with exception of a mild bulbar stricture that was passed by the scope without difficulty.  The prostatic urethra was short with bilobar hyperplasia and mild obstruction.  The bladder wall had mild trabeculation.  There were no tumors, stones or inflammation.  There was some old blood in the base of the bladder.  The ureteral orifices were unremarkable.  The left ureteral orifice was cannulated with a 5 French opening catheter and contrast was instilled.  The left retrograde pyelogram demonstrated a fragment just proximal to the meatus and contrast would not reflux above that initially.  An angled tip Glidewire was passed  through the open-ended catheter and I was able to negotiate that easily to the kidney.  The open-ended catheter was passed over the Glidewire to the kidney and the Glidewire was removed and replaced with a sensor wire.  The open-ended catheter was then removed leaving the sensor wire in place.  A 6 French by 26 cm contour double-J stent was then passed over the wire to the kidney under fluoroscopic guidance.  The wire was removed, leaving a good coil in the kidney and a good coil in the bladder.  A spot film after stent placement demonstrates small fragments along the proximal stent but I did not see anything that suggested the 10 x 3 mm fragment.  There were other stones in the mid lower pole of the kidney as well.  The bladder was drained and the cystoscope was removed.  He was taken down from lithotomy position, his anesthetic was reversed and he was moved to recovery in stable condition.  There were no complications.

## 2019-05-21 NOTE — Consult Note (Signed)
Subjective: CC: Left flank pain.  Hx: Mr. Langlois had left lithotripsy last week and came to the ER yesterday with left flank pain that was poorly controlled.  He was admitted to medicine and still has the pain.  A CT today demonstrated numerous fragments in the left proximal ureter with obstruction and 2 renal stones.  He has had no fever but has had nausea and the pain is intermittently severe in the left flank.  ROS:  Review of Systems  Gastrointestinal: Positive for abdominal pain and nausea.  Genitourinary: Positive for flank pain.  All other systems reviewed and are negative.   No Known Allergies  Past Medical History:  Diagnosis Date  . BPH (benign prostatic hyperplasia)   . Diabetes mellitus without complication ([place])    type 2  . GERD (gastroesophageal reflux disease)   . History of kidney stones    required lithotripsy around 2010, sees Dr. Bernardo Heater    Past Surgical History:  Procedure Laterality Date  . CHOLECYSTECTOMY    . COLONOSCOPY WITH PROPOFOL N/A 04/21/2019   Procedure: COLONOSCOPY WITH PROPOFOL;  Surgeon: Toledo, Benay Pike, MD;  Location: [facility] ENDOSCOPY;  Service: Endoscopy;  Laterality: N/A;  . EXTRACORPOREAL SHOCK WAVE LITHOTRIPSY Left 05/18/2019   Procedure: EXTRACORPOREAL SHOCK WAVE LITHOTRIPSY (ESWL);  Surgeon: Billey Co, MD;  Location: [facility] ORS;  Service: Urology;  Laterality: Left;  . HERNIA REPAIR    . TONSILLECTOMY      Social History   Socioeconomic History  . Marital status: Married    Spouse name: Not on file  . Number of children: Not on file  . Years of education: Not on file  . Highest education level: Not on file  Occupational History  . Not on file  Social Needs  . Financial resource strain: Not on file  . Food insecurity    Worry: Not on file    Inability: Not on file  . Transportation needs    Medical: Not on file    Non-medical: Not on file  Tobacco Use  . Smoking status: Never Smoker  . Smokeless tobacco: Never Used   Substance and Sexual Activity  . Alcohol use: Never    Frequency: Never  . Drug use: Never  . Sexual activity: Yes    Birth control/protection: None  Lifestyle  . Physical activity    Days per week: Not on file    Minutes per session: Not on file  . Stress: Not on file  Relationships  . Social Herbalist on phone: Not on file    Gets together: Not on file    Attends religious service: Not on file    Active member of club or organization: Not on file    Attends meetings of clubs or organizations: Not on file    Relationship status: Not on file  . Intimate partner violence    Fear of current or ex partner: Not on file    Emotionally abused: Not on file    Physically abused: Not on file    Forced sexual activity: Not on file  Other Topics Concern  . Not on file  Social History Narrative  . Not on file    Family History  Problem Relation Age of Onset  . Diabetes Mother   . Diabetes Father     Anti-infectives: Anti-infectives (From admission, onward)   Start     Dose/Rate Route Frequency Ordered Stop   05/21/19 1545  ceFAZolin (ANCEF) IVPB 2g/100 mL premix  2 g 200 mL/hr over 30 Minutes Intravenous  Once 05/21/19 1535        Current Facility-Administered Medications  Medication Dose Route Frequency Provider Last Rate Last Dose  . acetaminophen (TYLENOL) tablet 650 mg  650 mg Oral Q6H PRN Milagros LollSudini, Srikar, MD       Or  . acetaminophen (TYLENOL) suppository 650 mg  650 mg Rectal Q6H PRN Sudini, Wardell HeathSrikar, MD      . ceFAZolin (ANCEF) IVPB 2g/100 mL premix  2 g Intravenous Once Bjorn PippinWrenn, Beck Cofer, MD      . docusate sodium (COLACE) capsule 100 mg  100 mg Oral BID PRN Milagros LollSudini, Srikar, MD   100 mg at 05/21/19 1315  . insulin aspart (novoLOG) injection 0-15 Units  0-15 Units Subcutaneous TID WC Milagros LollSudini, Srikar, MD   5 Units at 05/21/19 1158  . insulin aspart (novoLOG) injection 0-5 Units  0-5 Units Subcutaneous QHS Milagros LollSudini, Srikar, MD   3 Units at 05/20/19 2151  . insulin  glargine (LANTUS) injection 50 Units  50 Units Subcutaneous QHS Milagros LollSudini, Srikar, MD   50 Units at 05/20/19 2150  . lactated ringers infusion   Intravenous Continuous Milagros LollSudini, Srikar, MD 100 mL/hr at 05/21/19 1600    . lisinopril (ZESTRIL) tablet 10 mg  10 mg Oral Daily Milagros LollSudini, Srikar, MD   10 mg at 05/21/19 1331  . morphine 4 MG/ML injection 4 mg  4 mg Intravenous Q4H PRN Milagros LollSudini, Srikar, MD   4 mg at 05/21/19 1257  . ondansetron (ZOFRAN) tablet 4 mg  4 mg Oral Q6H PRN Milagros LollSudini, Srikar, MD       Or  . ondansetron (ZOFRAN) injection 4 mg  4 mg Intravenous Q6H PRN Milagros LollSudini, Srikar, MD   4 mg at 05/21/19 0732  . oxyCODONE (Oxy IR/ROXICODONE) immediate release tablet 5 mg  5 mg Oral Q4H PRN Milagros LollSudini, Srikar, MD   5 mg at 05/21/19 0734  . pantoprazole (PROTONIX) EC tablet 40 mg  40 mg Oral Daily Milagros LollSudini, Srikar, MD   40 mg at 05/21/19 0746  . polyethylene glycol (MIRALAX / GLYCOLAX) packet 17 g  17 g Oral Daily PRN Sudini, Wardell HeathSrikar, MD      . pravastatin (PRAVACHOL) tablet 20 mg  20 mg Oral QHS Milagros LollSudini, Srikar, MD   20 mg at 05/20/19 2150  . tamsulosin (FLOMAX) capsule 0.4 mg  0.4 mg Oral Daily Milagros LollSudini, Srikar, MD   0.4 mg at 05/21/19 0746     Objective: Vital signs in last 24 hours: Temp:  [97.8 F (36.6 C)-99.3 F (37.4 C)] 98.4 F (36.9 C) (08/16 1132) Pulse Rate:  [68-107] 107 (08/16 1132) Resp:  [20] 20 (08/16 0618) BP: (143-149)/(73-100) 144/82 (08/16 1334) SpO2:  [94 %-98 %] 96 % (08/16 1132) Weight:  [102.6 kg] 102.6 kg (08/16 0618)  Intake/Output from previous day: 08/15 0701 - 08/16 0700 In: 1587.9 [P.O.:240; I.V.:1347.9] Out: 0  Intake/Output this shift: Total I/O In: 785.5 [I.V.:785.5] Out: 300 [Urine:300]   Physical Exam Vitals signs reviewed.  Constitutional:      Appearance: Normal appearance.  Neck:     Musculoskeletal: Normal range of motion and neck supple.  Cardiovascular:     Rate and Rhythm: Normal rate and regular rhythm.  Pulmonary:     Effort: Pulmonary effort is  normal. No respiratory distress.     Breath sounds: Normal breath sounds.  Abdominal:     General: Abdomen is flat.     Palpations: Abdomen is soft.     Tenderness: There is  left CVA tenderness.  Neurological:     Mental Status: He is alert.     Lab Results:  Recent Labs    05/20/19 0803 05/21/19 0315  WBC 7.0 5.1  HGB 13.9 11.6*  HCT 39.3 32.5*  PLT 186 157   BMET Recent Labs    05/20/19 0833 05/21/19 0315  NA 135 134*  K 4.2 3.7  CL 100 103  CO2 26 25  GLUCOSE 307* 187*  BUN 13 11  CREATININE 0.91 0.81  CALCIUM 8.8* 8.3*   PT/INR No results for input(s): LABPROT, INR in the last 72 hours. ABG No results for input(s): PHART, HCO3 in the last 72 hours.  Invalid input(s): PCO2, PO2  Studies/Results: Dg Abdomen 1 View  Result Date: 05/20/2019 CLINICAL DATA:  Status post lithotripsy.  Now with worsening pain EXAM: ABDOMEN - 1 VIEW COMPARISON:  05/18/2019 FINDINGS: The bowel gas pattern is normal. There are 3 calcifications identified within the mid and inferior pole of the left kidney measuring up to 7 mm. Within the proximal left ureter there is a cluster of multiple stones measuring 1.9 cm in length. The largest stone is seen distally measuring 4 mm. IMPRESSION: 1. Residual left mid and inferior pole calculi. Cluster of multiple stones within the proximal left ureter also noted with stones measuring up to 4 mm. . Electronically Signed   By: Signa Kellaylor  Stroud M.D.   On: 05/20/2019 08:42   Ct Renal Stone Study  Result Date: 05/21/2019 CLINICAL DATA:  Leg pain with recurrent stone disease suspected EXAM: CT ABDOMEN AND PELVIS WITHOUT CONTRAST TECHNIQUE: Multidetector CT imaging of the abdomen and pelvis was performed following the standard protocol without IV contrast. COMPARISON:  05/15/2019 FINDINGS: Lower chest: Small sliding hiatal hernia with lower esophageal fluid level. Hepatobiliary: No focal liver abnormality.Cholecystectomy. No bile duct dilatation. Pancreas:  Unremarkable. Spleen: Unremarkable. Adrenals/Urinary Tract: Negative adrenals. Interval fragmentation of the left ureteral stone with the largest conglomerate measuring 10 x 3 mm in the mid ureter. Just proximal is a 2 mm calculus. Punctate right and 3 left renal calculi with left-sided stone measuring up to 11 mm. Unremarkable bladder. Stomach/Bowel: No obstruction. No appendicitis. Sigmoid diverticulosis. Vascular/Lymphatic: No acute vascular abnormality. No mass or adenopathy. Reproductive:Enlarged prostate with nonspecific right-sided calcification. Other: No ascites or pneumoperitoneum. Bilateral inguinal hernia repair Musculoskeletal: No acute abnormalities. IMPRESSION: 1. Left urinary obstruction from partially fragmented mid ureteral calculus with the largest stone/conglomerate measuring 10 x 3 mm. 2. Left more than right nephrolithiasis. Electronically Signed   By: Marnee SpringJonathon  Watts M.D.   On: 05/21/2019 14:11     Assessment: Obstructing left ureteral fragments following ESWL with intractable pain.  He will have  Cystoscopy with stent insertion today and will need definitive therapy for the residual fragments and renal stones in the next week or so.   Risk of the procedure reviewed in detail.     CC: Dr. Wardell HeathSrikar Sudini and Dr. Irineo AxonScott Stoioff.     Bjorn PippinJohn Nahima Ales 05/21/2019 [phone]

## 2019-05-21 NOTE — Anesthesia Post-op Follow-up Note (Signed)
Anesthesia QCDR form completed.        

## 2019-05-21 NOTE — Transfer of Care (Signed)
Immediate Anesthesia Transfer of Care Note  Patient: Brandon [name]  Procedure(s) Performed: CYSTOSCOPY WITH RETROGRADE PYELOGRAM/URETERAL STENT PLACEMENT (Left )  Patient Location: PACU  Anesthesia Type:General  Level of Consciousness: awake, alert  and oriented  Airway & Oxygen Therapy: Patient Spontanous Breathing and Patient connected to face mask oxygen  Post-op Assessment: Report given to RN and Post -op Vital signs reviewed and stable  Post vital signs: Reviewed and stable  Last Vitals:  Vitals Value Taken Time  BP    Temp    Pulse    Resp    SpO2      Last Pain:  Vitals:   05/21/19 1327  TempSrc:   PainSc: 5          Complications: No apparent anesthesia complications

## 2019-05-21 NOTE — Progress Notes (Signed)
Patient concerned about getting constipated (which has happened in the past with narcotic use). Order received for colace and a K pad from Dr Darvin Neighbours

## 2019-05-21 NOTE — Progress Notes (Signed)
[facility] [place] NAME: Brandon [name]    MR#:  [id number]  DATE OF BIRTH:  08/19/1956  SUBJECTIVE:  CHIEF COMPLAINT:   Chief Complaint  Patient presents with  . Flank Pain     REVIEW OF SYSTEMS:    ROS  DRUG ALLERGIES:  No Known Allergies  VITALS:  Blood pressure (!) 143/100, pulse (!) 107, temperature 98.4 F (36.9 C), temperature source Oral, resp. rate 20, height 5\' 11"  (1.803 m), weight 102.6 kg, SpO2 96 %.  PHYSICAL EXAMINATION:   Physical Exam  GENERAL:  63 y.o.-year-old patient lying in the bed with no acute distress.  EYES: Pupils equal, round, reactive to light and accommodation. No scleral icterus. Extraocular muscles intact.  HEENT: Head atraumatic, normocephalic. Oropharynx and nasopharynx clear.  NECK:  Supple, no jugular venous distention. No thyroid enlargement, no tenderness.  LUNGS: Normal breath sounds bilaterally, no wheezing, rales, rhonchi. No use of accessory muscles of respiration.  CARDIOVASCULAR: S1, S2 normal. No murmurs, rubs, or gallops.  ABDOMEN: Soft, nontender, nondistended. Bowel sounds present. No organomegaly or mass.  EXTREMITIES: No cyanosis, clubbing or edema b/l.    NEUROLOGIC: Cranial nerves II through XII are intact. No focal Motor or sensory deficits b/l.   PSYCHIATRIC: The patient is alert and oriented x 3.  SKIN: No obvious rash, lesion, or ulcer.   LABORATORY PANEL:   CBC Recent Labs  Lab 05/21/19 0315  WBC 5.1  HGB 11.6*  HCT 32.5*  PLT 157   ------------------------------------------------------------------------------------------------------------------ Chemistries  Recent Labs  Lab 05/21/19 0315  NA 134*  K 3.7  CL 103  CO2 25  GLUCOSE 187*  BUN 11  CREATININE 0.81  CALCIUM 8.3*   ------------------------------------------------------------------------------------------------------------------  Cardiac Enzymes No results for input(s): TROPONINI in the last 168  hours. ------------------------------------------------------------------------------------------------------------------  RADIOLOGY:  Dg Abdomen 1 View  Result Date: 05/20/2019 CLINICAL DATA:  Status post lithotripsy.  Now with worsening pain EXAM: ABDOMEN - 1 VIEW COMPARISON:  05/18/2019 FINDINGS: The bowel gas pattern is normal. There are 3 calcifications identified within the mid and inferior pole of the left kidney measuring up to 7 mm. Within the proximal left ureter there is a cluster of multiple stones measuring 1.9 cm in length. The largest stone is seen distally measuring 4 mm. IMPRESSION: 1. Residual left mid and inferior pole calculi. Cluster of multiple stones within the proximal left ureter also noted with stones measuring up to 4 mm. . Electronically Signed   By: Kerby Moors M.D.   On: 05/20/2019 08:42     ASSESSMENT AND PLAN:   *Left ureteral stones.  Sending pain in spite of IV morphine and oxycodone. Started on Flomax yesterday. Will get a CT stone protocol Consult urology. Discussed with Dr. Jeffie Pollock.  *Diabetes mellitus. Continue Lantus.  Sliding scale insulin ordered.  *Hypertension.  Blood pressure in the normal range.  Hold lisinopril at this time.  DVT prophylaxis  No Lovenox due to hematuria  All the records are reviewed and case discussed with Care Management/Social Worker Management plans discussed with the patient, family and they are in agreement.  CODE STATUS: Full code   TOTAL TIME TAKING CARE OF THIS PATIENT: 35 minutes.   POSSIBLE D/C IN 1-2 DAYS, DEPENDING ON CLINICAL CONDITION.  Brandon [name] M.D on 05/21/2019 at 1:19 PM  Between 7am to 6pm - Pager - [phone]  After 6pm go to [email or web address] - password [email or web address] [facility]  Office  [phone]  CC: Primary care physician; Marisue IvanLinthavong, Kanhka, MD  Note: This dictation was prepared with Dragon dictation along with smaller phrase technology. Any transcriptional  errors that result from this process are unintentional.

## 2019-05-21 NOTE — Progress Notes (Signed)
Patient transported to surgery. Wife is in the room and informed that the surgeon will call her when surgery is complete

## 2019-05-22 ENCOUNTER — Telehealth: Payer: Self-pay | Admitting: Physician Assistant

## 2019-05-22 ENCOUNTER — Encounter: Payer: Self-pay | Admitting: Urology

## 2019-05-22 LAB — GLUCOSE, CAPILLARY
Glucose-Capillary: 269 mg/dL — ABNORMAL HIGH (ref 70–99)
Glucose-Capillary: 230 mg/dL — ABNORMAL HIGH (ref 70–99)

## 2019-05-22 MED ORDER — TAMSULOSIN HCL 0.4 MG PO CAPS: 0.4000 mg | ORAL_CAPSULE | Freq: Every day | ORAL | 0 refills | Status: DC

## 2019-05-22 NOTE — Progress Notes (Signed)
Inpatient Diabetes Program Recommendations  AACE/ADA: New Consensus Statement on Inpatient Glycemic Control (2015)  Target Ranges:  Prepandial:   less than 140 mg/dL      Peak postprandial:   less than 180 mg/dL (1-2 hours)      Critically ill patients:  140 - 180 mg/dL   Results for Brandon [name], Brandon [name] (MRN [id number]) as of 05/22/2019 10:05  Ref. Range 05/21/2019 07:41 05/21/2019 11:31 05/21/2019 16:58 05/21/2019 18:40 05/21/2019 21:10  Glucose-Capillary Latest Ref Range: 70 - 99 mg/dL 232 (H)  5 units NOVOLOG  210 (H)  5 units NOVOLOG  202 (H)    212 (H)  2 units NOVOLOG  234 (H)  2 units NOVOLOG +  50 units LANTUS   Results for Brandon [name], Brandon [name] (MRN [id number]) as of 05/22/2019 10:05  Ref. Range 05/22/2019 07:24  Glucose-Capillary Latest Ref Range: 70 - 99 mg/dL 269 (H)  8 units NOVOLOG    Results for Brandon [name], Brandon [name] (MRN [id number]) as of 05/22/2019 10:05  Ref. Range 05/20/2019 08:03  Hemoglobin A1C Latest Ref Range: 4.8 - 5.6 % 6.8 (H)    Admit with: Left ureteral stones  History: DM  Home DM Meds: Lantus 50 units QHS       Humalog 8-10 units TID       Metformin 500 mg BID  Current Orders: Lantus 50 units QHS      Novolog Moderate Correction Scale/ SSI (0-15 units) TID AC + HS      Underwent Cystoscopy with insertion of left double-J stent yest PM.  CBGs remain elevated.     MD- Please consider the following in-hospital insulin adjustments:  1. Increase Lantus to 55 units QHS  2. Start Novolog Meal Coverage: Novolog 6 units TID with meals  (Please add the following Hold Parameters: Hold if pt eats <50% of meal, Hold if pt NPO)     --Will follow patient during hospitalization--  Wyn Quaker RN, MSN, CDE Diabetes Coordinator Inpatient Glycemic Control Team Team Pager: [phone] (8a-5p)

## 2019-05-22 NOTE — Anesthesia Postprocedure Evaluation (Signed)
Anesthesia Post Note  Patient: Brandon [name]  Procedure(s) Performed: CYSTOSCOPY WITH RETROGRADE PYELOGRAM/URETERAL STENT PLACEMENT (Left )  Patient location during evaluation: PACU Anesthesia Type: General Level of consciousness: awake and alert Pain management: pain level controlled Vital Signs Assessment: post-procedure vital signs reviewed and stable Respiratory status: spontaneous breathing, nonlabored ventilation and respiratory function stable Cardiovascular status: blood pressure returned to baseline and stable Postop Assessment: no apparent nausea or vomiting Anesthetic complications: no     Last Vitals:  Vitals:   05/21/19 2132 05/22/19 0550  BP: 110/70 (!) 144/88  Pulse: 83 71  Resp: 20 18  Temp: 36.7 C 36.7 C  SpO2: 96% 99%    Last Pain:  Vitals:   05/22/19 0757  TempSrc:   PainSc: 0-No pain                 Alphonsus Sias

## 2019-05-22 NOTE — Progress Notes (Signed)
Brandon [name] to be D/C'd home per MD order.  Discussed prescriptions and follow up appointments with the patient. Prescriptions given to patient, medication list explained in detail. Pt verbalized understanding.  Allergies as of 05/22/2019   No Known Allergies     Medication List    TAKE these medications   aspirin EC 81 MG tablet Take 81 mg by mouth daily.   HumaLOG KwikPen 100 UNIT/ML KwikPen Generic drug: insulin lispro Inject 8-10 Units into the skin 3 (three) times daily with meals.   Lantus SoloStar 100 UNIT/ML Solostar Pen Generic drug: Insulin Glargine Inject 50 Units into the skin at bedtime.   lisinopril 5 MG tablet Commonly known as: ZESTRIL Take 5 mg by mouth daily.   metFORMIN 500 MG tablet Commonly known as: GLUCOPHAGE Take 500 mg by mouth 2 (two) times daily.   Multi-Vitamins Tabs Take 1 tablet by mouth daily.   OneTouch Delica Plus [id number] Misc Take by mouth.   OneTouch Ultra test strip Generic drug: glucose blood Use 4 (four) times daily   pravastatin 20 MG tablet Commonly known as: PRAVACHOL Take 20 mg by mouth at bedtime.   PriLOSEC OTC 20 MG tablet Generic drug: omeprazole Take 20 mg by mouth daily.   tamsulosin 0.4 MG Caps capsule Commonly known as: FLOMAX Take 1 capsule (0.4 mg total) by mouth daily. Start taking on: May 23, 2019   vitamin C 1000 MG tablet Take 1,000 mg by mouth daily.       Vitals:   05/21/19 2132 05/22/19 0550  BP: 110/70 (!) 144/88  Pulse: 83 71  Resp: 20 18  Temp: 98.1 F (36.7 C) 98.1 F (36.7 C)  SpO2: 96% 99%    Skin clean, dry and intact without evidence of skin break down, no evidence of skin tears noted. IV catheter discontinued intact. Site without signs and symptoms of complications. Dressing and pressure applied. Pt denies pain at this time. No complaints noted.  An After Visit Summary was printed and given to the patient. Patient escorted via [place], and D/C home via private auto.  Chuck Hint RN [facility] [employer]

## 2019-05-22 NOTE — Telephone Encounter (Signed)
Please schedule patient for follow-up with Dr. Bernardo Heater later this week with [facility] & to discuss definitive stone management if indicated per imaging.

## 2019-05-22 NOTE — Progress Notes (Signed)
Urology Inpatient Progress Note  Subjective: Brandon [name] is a 63 y.o. male who presented to the emergency department on 05/20/2019 s/p ESWL on 05/18/2019 with complaints of left flank pain and vomiting.  CT stone study with left ureteral stone fragments measuring up to 10x3 mm.  He underwent cystoscopy with placement of a left double-J stent with Dr. Annabell HowellsWrenn on 05/21/2019.  Today, patient reports significant relief of his flank pain since placement of the stent.  He is voiding spontaneously and does report some urinary incontinence and urge.  He reports having passed "sludge" in his urine.  White count 5.1 yesterday, creatinine 0.81.  Patient works for [employer] and his job requires heavy lifting of up to 60 pounds.  He is curious if he can return to work with the stent in place.  Anti-infectives: Anti-infectives (From admission, onward)   Start     Dose/Rate Route Frequency Ordered Stop   05/21/19 1545  ceFAZolin (ANCEF) IVPB 2g/100 mL premix     2 g 200 mL/hr over 30 Minutes Intravenous  Once 05/21/19 1535 05/21/19 1810      Current Facility-Administered Medications  Medication Dose Route Frequency Provider Last Rate Last Dose  . acetaminophen (TYLENOL) tablet 650 mg  650 mg Oral Q6H PRN Bjorn PippinWrenn, John, MD       Or  . acetaminophen (TYLENOL) suppository 650 mg  650 mg Rectal Q6H PRN Bjorn PippinWrenn, John, MD      . docusate sodium (COLACE) capsule 100 mg  100 mg Oral BID PRN Bjorn PippinWrenn, John, MD   100 mg at 05/21/19 2250  . insulin aspart (novoLOG) injection 0-15 Units  0-15 Units Subcutaneous TID WC Bjorn PippinWrenn, John, MD   8 Units at 05/22/19 0745  . insulin aspart (novoLOG) injection 0-5 Units  0-5 Units Subcutaneous QHS Bjorn PippinWrenn, John, MD   2 Units at 05/21/19 2139  . insulin glargine (LANTUS) injection 50 Units  50 Units Subcutaneous QHS Bjorn PippinWrenn, John, MD   50 Units at 05/21/19 2139  . lactated ringers infusion   Intravenous Continuous Bjorn PippinWrenn, John, MD 100 mL/hr at 05/22/19 0545    . lisinopril (ZESTRIL) tablet 10  mg  10 mg Oral Daily Bjorn PippinWrenn, John, MD   10 mg at 05/22/19 0818  . morphine 4 MG/ML injection 4 mg  4 mg Intravenous Q4H PRN Bjorn PippinWrenn, John, MD   4 mg at 05/21/19 1257  . ondansetron (ZOFRAN) tablet 4 mg  4 mg Oral Q6H PRN Bjorn PippinWrenn, John, MD       Or  . ondansetron [facility](ZOFRAN) injection 4 mg  4 mg Intravenous Q6H PRN Bjorn PippinWrenn, John, MD   4 mg at 05/21/19 0732  . oxyCODONE (Oxy IR/ROXICODONE) immediate release tablet 5 mg  5 mg Oral Q4H PRN Bjorn PippinWrenn, John, MD   5 mg at 05/21/19 0734  . pantoprazole (PROTONIX) EC tablet 40 mg  40 mg Oral Daily Bjorn PippinWrenn, John, MD   40 mg at 05/22/19 0818  . polyethylene glycol (MIRALAX / GLYCOLAX) packet 17 g  17 g Oral Daily PRN Bjorn PippinWrenn, John, MD      . pravastatin (PRAVACHOL) tablet 20 mg  20 mg Oral QHS Bjorn PippinWrenn, John, MD   20 mg at 05/21/19 2139  . tamsulosin (FLOMAX) capsule 0.4 mg  0.4 mg Oral Daily Bjorn PippinWrenn, John, MD   0.4 mg at 05/22/19 0818     Objective: Vital signs in last 24 hours: Temp:  [98.1 F (36.7 C)-99.1 F (37.3 C)] 98.1 F (36.7 C) (08/17 0550) Pulse Rate:  [71-107] 71 (  08/17 0550) Resp:  [14-24] 18 (08/17 0550) BP: (110-144)/(70-100) 144/88 (08/17 0550) SpO2:  [93 %-99 %] 99 % (08/17 0550) Weight:  [103.2 kg] 103.2 kg (08/17 0550)  Intake/Output from previous day: 08/16 0701 - 08/17 0700 In: 1585.4 [I.V.:1585.4] Out: 301 [Urine:300; Blood:1] Intake/Output this shift: Total I/O In: 240 [P.O.:240] Out: -   Physical Exam Vitals signs and nursing note reviewed.  Constitutional:      General: He is not in acute distress.    Appearance: Normal appearance. He is normal weight. He is not ill-appearing, toxic-appearing or diaphoretic.  HENT:     Head: Normocephalic and atraumatic.  Pulmonary:     Effort: Pulmonary effort is normal. No respiratory distress.  Abdominal:     General: Abdomen is flat.  Genitourinary:    Comments: Blood clot containing debris left in hat in restroom Skin:    General: Skin is warm and dry.  Neurological:     Mental Status:  He is alert and oriented to person, place, and time.  Psychiatric:        Mood and Affect: Mood normal.        Behavior: Behavior normal.     Lab Results:  Recent Labs    05/20/19 0803 05/21/19 0315  WBC 7.0 5.1  HGB 13.9 11.6*  HCT 39.3 32.5*  PLT 186 157   BMET Recent Labs    05/20/19 0833 05/21/19 0315  NA 135 134*  K 4.2 3.7  CL 100 103  CO2 26 25  GLUCOSE 307* 187*  BUN 13 11  CREATININE 0.91 0.81  CALCIUM 8.8* 8.3*   Studies/Results: Ct Renal Stone Study  Result Date: 05/21/2019 CLINICAL DATA:  Leg pain with recurrent stone disease suspected EXAM: CT ABDOMEN AND PELVIS WITHOUT CONTRAST TECHNIQUE: Multidetector CT imaging of the abdomen and pelvis was performed following the standard protocol without IV contrast. COMPARISON:  05/15/2019 FINDINGS: Lower chest: Small sliding hiatal hernia with lower esophageal fluid level. Hepatobiliary: No focal liver abnormality.Cholecystectomy. No bile duct dilatation. Pancreas: Unremarkable. Spleen: Unremarkable. Adrenals/Urinary Tract: Negative adrenals. Interval fragmentation of the left ureteral stone with the largest conglomerate measuring 10 x 3 mm in the mid ureter. Just proximal is a 2 mm calculus. Punctate right and 3 left renal calculi with left-sided stone measuring up to 11 mm. Unremarkable bladder. Stomach/Bowel: No obstruction. No appendicitis. Sigmoid diverticulosis. Vascular/Lymphatic: No acute vascular abnormality. No mass or adenopathy. Reproductive:Enlarged prostate with nonspecific right-sided calcification. Other: No ascites or pneumoperitoneum. Bilateral inguinal hernia repair Musculoskeletal: No acute abnormalities. IMPRESSION: 1. Left urinary obstruction from partially fragmented mid ureteral calculus with the largest stone/conglomerate measuring 10 x 3 mm. 2. Left more than right nephrolithiasis. Electronically Signed   By: Marnee SpringJonathon  Watts M.D.   On: 05/21/2019 14:11   I personally reviewed the imaging above and  agree with the radiologic findings, however I would add the presence of some left-sided hydroureteronephrosis secondary to obstruction.  Assessment & Plan: Patient clinically stable following left stent placement for obstructive uropathy s/p ESWL.  Interval passage of sludge and clots per urine.  We will repeat imaging with KUB later this week to assess for fragment retention or migration and plan for definitive stone management if indicated at that time.  Okay to discharge today from a urologic perspective.  I did counsel him that he should refrain from heavy lifting for the duration of his stent being in place.  He believes this to be feasible in his workplace.  I counseled the patient that  stents can cause pain in the flank or groin and/or gross hematuria. I informed him that he may expect to experience these symptoms for the duration of the stent being in place. I counseled him that he should follow up with [place] urgently if he develops new fever, chills, nausea, or vomiting before it is removed, as these are not typical symptoms associated with a stent. He expressed understanding of this plan.  -Follow-up in clinic later this week with KUB to discuss definitive stone management, if indicated -Okay to discharge today on p.o. pain medication and Flomax  Debroah Loop, PA-C 05/22/2019

## 2019-05-22 NOTE — Telephone Encounter (Signed)
Spoke with Sam ok to leave app where it is for now MD's will talk about it and if it needs to be moved she will let me know.

## 2019-05-25 ENCOUNTER — Telehealth: Payer: Self-pay | Admitting: Urology

## 2019-05-25 DIAGNOSIS — N200 Calculus of kidney: Secondary | ICD-10-CM

## 2019-05-25 NOTE — Telephone Encounter (Signed)
Patient has an upcoming app an needs an order for a KUB please  Thanks, Sharyn Lull

## 2019-05-29 ENCOUNTER — Telehealth: Payer: Self-pay | Admitting: Urology

## 2019-05-29 NOTE — Telephone Encounter (Signed)
How should he proceed? 

## 2019-05-29 NOTE — Discharge Summary (Signed)
[facility] NAME: Brandon [name]    MR#:  [id number]  DATE OF BIRTH:  March 17, 1956  DATE OF ADMISSION:  05/20/2019 ADMITTING PHYSICIAN: Hillary Bow, MD  DATE OF DISCHARGE: 05/22/2019  4:02 PM  PRIMARY CARE PHYSICIAN: Dion Body, MD   ADMISSION DIAGNOSIS:  Ureterolithiasis [N20.1]  DISCHARGE DIAGNOSIS:  Active Problems:   Nephrolithiasis   Ureteral calculi   SECONDARY DIAGNOSIS:   Past Medical History:  Diagnosis Date  . BPH (benign prostatic hyperplasia)   . Diabetes mellitus without complication ([place])    type 2  . GERD (gastroesophageal reflux disease)   . History of kidney stones    required lithotripsy around 2010, sees Dr. Bernardo Heater     ADMITTING HISTORY  HISTORY OF PRESENT ILLNESS:  Brandon [name]  is a 63 y.o. male with a known history of diabetes mellitus, BPH, nephrolithiasis who was diagnosed with left ureteral stones on 05/15/2019 and had lithotripsy on 05/18/2019 presents today with intractable left flank pain.  He has received multiple doses of Toradol and morphine and still in pain.  He has also noticed hematuria.  Did not see any stones being passed.  Patient is being admitted for pain control.  An x-ray showed multiple ureteral stones of up to 4 mm.  HOSPITAL COURSE:    *Left ureteral stones.   admit to medical floor for pain control. Started on Flomax .   Due to intractable pain a CT stone protocol was check.  Discussed with urology.  Patient was taken to the operating room and a stent placed.  With this his pain resolved.    No further hematuria.  No signs of infection.  With improved pain case was discussed with Urology inpatient discharged home to follow-up with them as outpatient.  *Diabetes mellitus. Continue Lantus. Sliding scale insulin   In the hospital.  *Hypertension. Blood pressure in the normal range.   No change to medications at discharge  DVT prophylaxis  No Lovenox  Used in the  hospital due to hematuria.  SCDs placed   patient discharged home in stable condition  CONSULTS OBTAINED:    urology  DRUG ALLERGIES:  No Known Allergies  DISCHARGE MEDICATIONS:   Allergies as of 05/22/2019   No Known Allergies     Medication List    TAKE these medications   aspirin EC 81 MG tablet Take 81 mg by mouth daily.   HumaLOG KwikPen 100 UNIT/ML KwikPen Generic drug: insulin lispro Inject 8-10 Units into the skin 3 (three) times daily with meals.   Lantus SoloStar 100 UNIT/ML Solostar Pen Generic drug: Insulin Glargine Inject 50 Units into the skin at bedtime.   lisinopril 5 MG tablet Commonly known as: ZESTRIL Take 5 mg by mouth daily.   metFORMIN 500 MG tablet Commonly known as: GLUCOPHAGE Take 500 mg by mouth 2 (two) times daily.   Multi-Vitamins Tabs Take 1 tablet by mouth daily.   OneTouch Delica Plus [id number] Misc Take by mouth.   OneTouch Ultra test strip Generic drug: glucose blood Use 4 (four) times daily   pravastatin 20 MG tablet Commonly known as: PRAVACHOL Take 20 mg by mouth at bedtime.   PriLOSEC OTC 20 MG tablet Generic drug: omeprazole Take 20 mg by mouth daily.   tamsulosin 0.4 MG Caps capsule Commonly known as: FLOMAX Take 1 capsule (0.4 mg total) by mouth daily.   vitamin C 1000 MG tablet Take 1,000 mg by mouth daily.  Today   VITAL SIGNS:  Blood pressure (!) 144/88, pulse 71, temperature 98.1 F (36.7 C), temperature source Oral, resp. rate 18, height 5\' 11"  (1.803 m), weight 103.2 kg, SpO2 99 %.  I/O:  No intake or output data in the 24 hours ending 05/29/19 1758  PHYSICAL EXAMINATION:  Physical Exam  GENERAL:  63 y.o.-year-old patient lying in the bed with no acute distress.  LUNGS: Normal breath sounds bilaterally, no wheezing, rales,rhonchi or crepitation. No use of accessory muscles of respiration.  CARDIOVASCULAR: S1, S2 normal. No murmurs, rubs, or gallops.  ABDOMEN: Soft, non-tender,  non-distended. Bowel sounds present. No organomegaly or mass.  NEUROLOGIC: Moves all 4 extremities. PSYCHIATRIC: The patient is alert and oriented x 3.  SKIN: No obvious rash, lesion, or ulcer.   DATA REVIEW:   CBC No results for input(s): WBC, HGB, HCT, PLT in the last 168 hours.  Chemistries  No results for input(s): NA, K, CL, CO2, GLUCOSE, BUN, CREATININE, CALCIUM, MG, AST, ALT, ALKPHOS, BILITOT in the last 168 hours.  Invalid input(s): GFRCGP  Cardiac Enzymes No results for input(s): TROPONINI in the last 168 hours.  Microbiology Results  Results for orders placed or performed during the hospital encounter of 05/20/19  SARS CORONAVIRUS 2 Nasal Swab Aptima Multi Swab     Status: None   Collection Time: 05/20/19 10:33 AM   Specimen: Aptima Multi Swab; Nasal Swab  Result Value Ref Range Status   SARS Coronavirus 2 NEGATIVE NEGATIVE Final    Comment: (NOTE) SARS-CoV-2 target nucleic acids are NOT DETECTED. The SARS-CoV-2 RNA is generally detectable in upper and lower respiratory specimens during the acute phase of infection. Negative results do not preclude SARS-CoV-2 infection, do not rule out co-infections with other pathogens, and should not be used as the sole basis for treatment or other patient management decisions. Negative results must be combined with clinical observations, patient history, and epidemiological information. The expected result is Negative. Fact Sheet for Patients: HairSlick.nohttps://www.fda.gov/media/138098/download Fact Sheet for Healthcare Providers: quierodirigir.comhttps://www.fda.gov/media/138095/download This test is not yet approved or cleared by the [place] FDA and  has been authorized for detection and/or diagnosis of SARS-CoV-2 by FDA under an Emergency Use Authorization (EUA). This EUA will remain  in effect (meaning this test can be used) for the duration of the COVID-19 declaration under Section 56 4(b)(1) of the Act, 21 U.S.C. section 360bbb-3(b)(1),  unless the authorization is terminated or revoked sooner. Performed at [facility], [address] [place], [place] [address]     RADIOLOGY:  No results found.  Follow up with PCP in 1 week.  Management plans discussed with the patient, family and they are in agreement.  CODE STATUS:  Code Status History    Date Active Date Inactive Code Status Order ID Comments User Context   05/20/2019 1210 05/22/2019 1903 Full Code [id number]  Milagros LollSudini, Wade Asebedo, MD ED   09/20/2018 0211 09/20/2018 1641 Full Code [id number]  Oralia ManisWillis, David, MD ED   Advance Care Planning Activity    Advance Directive Documentation     Most Recent Value  Type of Advance Directive  Living will  Pre-existing out of facility DNR order (yellow form or pink MOST form)  -  "MOST" Form in Place?  -      TOTAL TIME TAKING CARE OF THIS PATIENT ON DAY OF DISCHARGE: more than 30 minutes.   Orie FishermanSrikar R Iwalani Templeton M.D on 05/29/2019 at 5:58 PM  Between 7am to 6pm - Pager - [phone]  After  6pm go to [email or web address] - password [email or web address] [facility]  [facility]  Office  [phone]  CC: Primary care physician; Marisue IvanLinthavong, Kanhka, MD  Note: This dictation was prepared with Dragon dictation along with smaller phrase technology. Any transcriptional errors that result from this process are unintentional.

## 2019-05-29 NOTE — Telephone Encounter (Signed)
Pt states that he has stopped the Flomax due to it causing him to be dizzy. Please advise.

## 2019-05-29 NOTE — Telephone Encounter (Signed)
It looks like Dr. Jeffie Pollock placed a stent last weekend.  He can discontinue the Flomax

## 2019-05-30 NOTE — Telephone Encounter (Signed)
I called pt back and read pt the note and he voiced understanding.

## 2019-06-01 ENCOUNTER — Ambulatory Visit: Payer: Managed Care, Other (non HMO) | Admitting: Urology

## 2019-06-08 ENCOUNTER — Other Ambulatory Visit: Payer: Self-pay | Admitting: Radiology

## 2019-06-08 ENCOUNTER — Encounter: Payer: Self-pay | Admitting: Urology

## 2019-06-08 ENCOUNTER — Ambulatory Visit
Admission: RE | Admit: 2019-06-08 | Discharge: 2019-06-08 | Disposition: A | Discharge status: Home / Self Care | Payer: Managed Care, Other (non HMO) | Source: Ambulatory Visit | Attending: Urology | Admitting: Urology

## 2019-06-08 ENCOUNTER — Other Ambulatory Visit: Payer: Self-pay

## 2019-06-08 ENCOUNTER — Telehealth: Payer: Self-pay | Admitting: Radiology

## 2019-06-08 ENCOUNTER — Ambulatory Visit (INDEPENDENT_AMBULATORY_CARE_PROVIDER_SITE_OTHER): Payer: Managed Care, Other (non HMO) | Admitting: Urology

## 2019-06-08 ENCOUNTER — Ambulatory Visit: Payer: Managed Care, Other (non HMO) | Admitting: Urology

## 2019-06-08 VITALS — BP 124/84 | HR 80 | Ht 70.0 in | Wt 228.0 lb

## 2019-06-08 DIAGNOSIS — N201 Calculus of ureter: Secondary | ICD-10-CM

## 2019-06-08 DIAGNOSIS — N132 Hydronephrosis with renal and ureteral calculous obstruction: Secondary | ICD-10-CM

## 2019-06-08 DIAGNOSIS — N200 Calculus of kidney: Secondary | ICD-10-CM

## 2019-06-08 MED ORDER — OXYBUTYNIN CHLORIDE 5 MG PO TABS: ORAL_TABLET | ORAL | 0 refills | Status: DC

## 2019-06-08 NOTE — Telephone Encounter (Signed)
Patient states a medication was discussed in the office to help with stent pain. Please send in script.

## 2019-06-08 NOTE — H&P (View-Only) (Signed)
06/08/2019 4:19 PM   Brandon [name] 04/12/1956 [id number]  Referring provider: Marisue IvanLinthavong, Kanhka, MD [phone] [facility] [place],  [place] [address]  Chief Complaint  Patient presents with  . Routine Post Op    HPI: 63 y.o. male presents for follow-up.  He originally presented 8/10 with left renal colic secondary to a 9 mm left proximal ureteral calculus.  He also had nonobstructing left renal calculi.  After discussing treatment options he elected shockwave lithotripsy of the proximal stone which was performed by Dr. Richardo HanksSninsky on 05/18/2019.  He presented to the ED on 05/20/2019 with severe left renal colic with left ureteral fragments and intractable pain.  He underwent stent placement 05/21/2019.  His pain resolved after stent placement however he is having mild to moderate stent irritative symptoms.  He presents to discuss management options.  Denies fever or chills.   PMH: Past Medical History:  Diagnosis Date  . BPH (benign prostatic hyperplasia)   . Diabetes mellitus without complication ([facility])    type 2  . GERD (gastroesophageal reflux disease)   . History of kidney stones    required lithotripsy around 2010, sees Dr. Lonna CobbStoioff    Surgical History: Past Surgical History:  Procedure Laterality Date  . CHOLECYSTECTOMY    . COLONOSCOPY WITH PROPOFOL N/A 04/21/2019   Procedure: COLONOSCOPY WITH PROPOFOL;  Surgeon: Toledo, Boykin Nearingeodoro K, MD;  Location: [facility] ENDOSCOPY;  Service: Endoscopy;  Laterality: N/A;  . CYSTOSCOPY W/ URETERAL STENT PLACEMENT Left 05/21/2019   Procedure: CYSTOSCOPY WITH RETROGRADE PYELOGRAM/URETERAL STENT PLACEMENT;  Surgeon: Bjorn PippinWrenn, John, MD;  Location: [facility] ORS;  Service: Urology;  Laterality: Left;  . EXTRACORPOREAL SHOCK WAVE LITHOTRIPSY Left 05/18/2019   Procedure: EXTRACORPOREAL SHOCK WAVE LITHOTRIPSY (ESWL);  Surgeon: Sondra ComeSninsky, Brian C, MD;  Location: [facility] ORS;  Service: Urology;  Laterality: Left;  . HERNIA REPAIR    . TONSILLECTOMY      Home Medications:  Allergies as of 06/08/2019   No Known Allergies     Medication List       Accurate as of June 08, 2019  4:19 PM. If you have any questions, ask your nurse or doctor.        aspirin EC 81 MG tablet Take 81 mg by mouth daily.   HumaLOG KwikPen 100 UNIT/ML KwikPen Generic drug: insulin lispro Inject 8-10 Units into the skin 3 (three) times daily with meals.   Lantus SoloStar 100 UNIT/ML Solostar Pen Generic drug: Insulin Glargine Inject 50 Units into the skin at bedtime.   lisinopril 5 MG tablet Commonly known as: ZESTRIL Take 5 mg by mouth daily.   metFORMIN 500 MG tablet Commonly known as: GLUCOPHAGE Take 500 mg by mouth 2 (two) times daily.   Multi-Vitamins Tabs Take 1 tablet by mouth daily.   OneTouch Delica Plus [id number] Misc Take by mouth.   OneTouch Ultra test strip Generic drug: glucose blood Use 4 (four) times daily   pravastatin 20 MG tablet Commonly known as: PRAVACHOL Take 20 mg by mouth at bedtime.   PriLOSEC OTC 20 MG tablet Generic drug: omeprazole Take 20 mg by mouth daily.   tamsulosin 0.4 MG Caps capsule Commonly known as: FLOMAX Take 1 capsule (0.4 mg total) by mouth daily.   vitamin C 1000 MG tablet Take 1,000 mg by mouth daily.       Allergies: No Known Allergies  Family History: Family History  Problem Relation Age of Onset  . Diabetes Mother   . Diabetes Father  Social History:  reports that he has never smoked. He has never used smokeless tobacco. He reports that he does not drink alcohol or use drugs.  ROS: UROLOGY Frequent Urination?: Yes Hard to postpone urination?: No Burning/pain with urination?: No Get up at night to urinate?: Yes Leakage of urine?: No Urine stream starts and stops?: No Trouble starting stream?: No Do you have to strain to urinate?: No Blood in urine?: No Urinary tract infection?: No Sexually transmitted disease?: No Injury to kidneys or bladder?: No Painful  intercourse?: No Weak stream?: No Erection problems?: No Penile pain?: No  Gastrointestinal Nausea?: No Vomiting?: No Indigestion/heartburn?: No Diarrhea?: No Constipation?: No  Constitutional Fever: No Night sweats?: No Weight loss?: No Fatigue?: No  Skin Skin rash/lesions?: No Itching?: No  Eyes Blurred vision?: No Double vision?: No  Ears/Nose/Throat Sore throat?: No Sinus problems?: No  Hematologic/Lymphatic Swollen glands?: No Easy bruising?: No  Cardiovascular Leg swelling?: No Chest pain?: No  Respiratory Cough?: No Shortness of breath?: No  Endocrine Excessive thirst?: No  Musculoskeletal Back pain?: No Joint pain?: No  Neurological Headaches?: No Dizziness?: No  Psychologic Depression?: No Anxiety?: No  Physical Exam: BP 124/84   Pulse 80   Ht 5\' 10"  (1.778 m)   Wt 228 lb (103.4 kg)   BMI 32.71 kg/m   Constitutional:  Alert and oriented, No acute distress. HEENT: [place] AT, moist mucus membranes.  Trachea midline, no masses. Cardiovascular: No clubbing, cyanosis, or edema. RRR Respiratory: Normal respiratory effort, no increased work of breathing.  Clear  GI: Abdomen is soft, nontender, nondistended, no abdominal masses GU: No CVA tenderness Lymph: No cervical or inguinal lymphadenopathy. Skin: No rashes, bruises or suspicious lesions. Neurologic: Grossly intact, no focal deficits, moving all 4 extremities. Psychiatric: Normal mood and affect.   Assessment & Plan:    - Left ureteral stone fragments Status post left ureteral stent placement for obstructing ureteral fragment status post shockwave lithotripsy.  KUB today shows the stent in good position and a relatively large fragment in the proximal ureter alongside the stent.  Nonobstructing left renal calculi noted.  Management options were discussed for definitive treatment including repeat shockwave lithotripsy versus ureteroscopy.  He was informed the advantage of ureteroscopy  would be all of his calculi could be treated and only the proximal calculi with shockwave lithotripsy.  He would prefer scheduling ureteroscopy.  The indications and nature of the planned procedure were discussed as well as the potential  benefits and expected outcome.  Alternatives have been discussed in detail. The most common complications and side effects were discussed including but not limited to infection/sepsis; blood loss; damage to urethra, bladder, ureter, kidney; need for multiple surgeries; need for prolonged stent placement as well as general anesthesia risks. Although uncommon he was also informed of the possibility that the renal calculi may not be able to be treated due to inability to obtain access to the upper ureter. All of his questions were answered and he desires to proceed.    Rx oxybutynin 5 mg 3 times daily as needed for stent symptoms was sent to pharmacy.  - Nephrolithiasis As above   Abbie Sons, [place] [address], [place], [place] [address] [phone]

## 2019-06-08 NOTE — Progress Notes (Signed)
 06/08/2019 4:19 PM   Michele L Bromwell 01/08/1956 [id number]  Referring provider: Linthavong, Kanhka, MD [address] [facility] [place],  [place] [address]  Chief Complaint  Patient presents with  . Routine Post Op    HPI: 63 y.o. male presents for follow-up.  He originally presented 8/10 with left renal colic secondary to a 9 mm left proximal ureteral calculus.  He also had nonobstructing left renal calculi.  After discussing treatment options he elected shockwave lithotripsy of the proximal stone which was performed by Dr. Sninsky on 05/18/2019.  He presented to the ED on 05/20/2019 with severe left renal colic with left ureteral fragments and intractable pain.  He underwent stent placement 05/21/2019.  His pain resolved after stent placement however he is having mild to moderate stent irritative symptoms.  He presents to discuss management options.  Denies fever or chills.   PMH: Past Medical History:  Diagnosis Date  . BPH (benign prostatic hyperplasia)   . Diabetes mellitus without complication (HCC)    type 2  . GERD (gastroesophageal reflux disease)   . History of kidney stones    required lithotripsy around 2010, sees Dr. Stoioff    Surgical History: Past Surgical History:  Procedure Laterality Date  . CHOLECYSTECTOMY    . COLONOSCOPY WITH PROPOFOL N/A 04/21/2019   Procedure: COLONOSCOPY WITH PROPOFOL;  Surgeon: Toledo, Teodoro K, MD;  Location: [facility] END[facility];  Service: Endoscopy;  Laterality: N/A;  . CYSTOSCOPY W/ URETERAL STENT PLACEMENT Left 05/21/2019   Procedure: CYSTOSCOPY WITH RETROGRADE PYELOGRAM/URETERAL STENT PLACEMENT;  Surgeon: Wrenn, John, MD;  Location: [facility] ORS;  Service: Urology;  Laterality: Left;  . EXTRACORPOREAL SHOCK WAVE LITHOTRIPSY Left 05/18/2019   Procedure: EXTRACORPOREAL SHOCK WAVE LITHOTRIPSY (ESWL);  Surgeon: Sninsky, Brian C, MD;  Location: [facility] ORS;  Service: Urology;  Laterality: Left;  . HERNIA REPAIR    . TONSILLECTOMY      Home Medications:  Allergies as of 06/08/2019   No Known Allergies     Medication List       Accurate as of June 08, 2019  4:19 PM. If you have any questions, ask your nurse or doctor.        aspirin EC 81 MG tablet Take 81 mg by mouth daily.   HumaLOG KwikPen 100 UNIT/ML KwikPen Generic drug: insulin lispro Inject 8-10 Units into the skin 3 (three) times daily with meals.   Lantus SoloStar 100 UNIT/ML Solostar Pen Generic drug: Insulin Glargine Inject 50 Units into the skin at bedtime.   lisinopril 5 MG tablet Commonly known as: ZESTRIL Take 5 mg by mouth daily.   metFORMIN 500 MG tablet Commonly known as: GLUCOPHAGE Take 500 mg by mouth 2 (two) times daily.   Multi-Vitamins Tabs Take 1 tablet by mouth daily.   OneTouch Delica Plus Lancet33G Misc Take by mouth.   OneTouch Ultra test strip Generic drug: glucose blood Use 4 (four) times daily   pravastatin 20 MG tablet Commonly known as: PRAVACHOL Take 20 mg by mouth at bedtime.   PriLOSEC OTC 20 MG tablet Generic drug: omeprazole Take 20 mg by mouth daily.   tamsulosin 0.4 MG Caps capsule Commonly known as: FLOMAX Take 1 capsule (0.4 mg total) by mouth daily.   vitamin C 1000 MG tablet Take 1,000 mg by mouth daily.       Allergies: No Known Allergies  Family History: Family History  Problem Relation Age of Onset  . Diabetes Mother   . Diabetes Father       Social History:  reports that he has never smoked. He has never used smokeless tobacco. He reports that he does not drink alcohol or use drugs.  ROS: UROLOGY Frequent Urination?: Yes Hard to postpone urination?: No Burning/pain with urination?: No Get up at night to urinate?: Yes Leakage of urine?: No Urine stream starts and stops?: No Trouble starting stream?: No Do you have to strain to urinate?: No Blood in urine?: No Urinary tract infection?: No Sexually transmitted disease?: No Injury to kidneys or bladder?: No Painful  intercourse?: No Weak stream?: No Erection problems?: No Penile pain?: No  Gastrointestinal Nausea?: No Vomiting?: No Indigestion/heartburn?: No Diarrhea?: No Constipation?: No  Constitutional Fever: No Night sweats?: No Weight loss?: No Fatigue?: No  Skin Skin rash/lesions?: No Itching?: No  Eyes Blurred vision?: No Double vision?: No  Ears/Nose/Throat Sore throat?: No Sinus problems?: No  Hematologic/Lymphatic Swollen glands?: No Easy bruising?: No  Cardiovascular Leg swelling?: No Chest pain?: No  Respiratory Cough?: No Shortness of breath?: No  Endocrine Excessive thirst?: No  Musculoskeletal Back pain?: No Joint pain?: No  Neurological Headaches?: No Dizziness?: No  Psychologic Depression?: No Anxiety?: No  Physical Exam: BP 124/84   Pulse 80   Ht 5\' 10"  (1.778 m)   Wt 228 lb (103.4 kg)   BMI 32.71 kg/m   Constitutional:  Alert and oriented, No acute distress. HEENT: [place] AT, moist mucus membranes.  Trachea midline, no masses. Cardiovascular: No clubbing, cyanosis, or edema. RRR Respiratory: Normal respiratory effort, no increased work of breathing.  Clear  GI: Abdomen is soft, nontender, nondistended, no abdominal masses GU: No CVA tenderness Lymph: No cervical or inguinal lymphadenopathy. Skin: No rashes, bruises or suspicious lesions. Neurologic: Grossly intact, no focal deficits, moving all 4 extremities. Psychiatric: Normal mood and affect.   Assessment & Plan:    - Left ureteral stone fragments Status post left ureteral stent placement for obstructing ureteral fragment status post shockwave lithotripsy.  KUB today shows the stent in good position and a relatively large fragment in the proximal ureter alongside the stent.  Nonobstructing left renal calculi noted.  Management options were discussed for definitive treatment including repeat shockwave lithotripsy versus ureteroscopy.  He was informed the advantage of ureteroscopy  would be all of his calculi could be treated and only the proximal calculi with shockwave lithotripsy.  He would prefer scheduling ureteroscopy.  The indications and nature of the planned procedure were discussed as well as the potential  benefits and expected outcome.  Alternatives have been discussed in detail. The most common complications and side effects were discussed including but not limited to infection/sepsis; blood loss; damage to urethra, bladder, ureter, kidney; need for multiple surgeries; need for prolonged stent placement as well as general anesthesia risks. Although uncommon he was also informed of the possibility that the renal calculi may not be able to be treated due to inability to obtain access to the upper ureter. All of his questions were answered and he desires to proceed.    Rx oxybutynin 5 mg 3 times daily as needed for stent symptoms was sent to pharmacy.  - Nephrolithiasis As above   Abbie Sons, [place] [address], [place], [place] [address] [phone]

## 2019-06-09 ENCOUNTER — Encounter: Payer: Self-pay | Admitting: Urology

## 2019-06-11 LAB — CULTURE, URINE COMPREHENSIVE

## 2019-06-13 ENCOUNTER — Telehealth: Payer: Self-pay | Admitting: Radiology

## 2019-06-13 ENCOUNTER — Other Ambulatory Visit: Payer: Self-pay | Admitting: Urology

## 2019-06-13 MED ORDER — DOXYCYCLINE HYCLATE 100 MG PO CAPS: 100.0000 mg | ORAL_CAPSULE | Freq: Two times a day (BID) | ORAL | 0 refills | Status: AC

## 2019-06-13 NOTE — Telephone Encounter (Signed)
Notes recorded by Abbie Sons, MD on 06/13/2019 at 7:31 AM EDT  Urine culture had a low level of bacteria. Antibiotic Rx was sent to pharmacy.    Patient made aware of Dr Dene Gentry note above & script sent to pharmacy. Patient expresses understanding of instructions.

## 2019-06-20 ENCOUNTER — Other Ambulatory Visit
Admission: RE | Admit: 2019-06-20 | Discharge: 2019-06-20 | Disposition: A | Discharge status: Home / Self Care | Payer: Managed Care, Other (non HMO) | Source: Ambulatory Visit | Attending: Urology | Admitting: Urology

## 2019-06-20 ENCOUNTER — Other Ambulatory Visit: Payer: Self-pay

## 2019-06-20 ENCOUNTER — Telehealth: Payer: Self-pay | Admitting: Urology

## 2019-06-20 DIAGNOSIS — I491 Atrial premature depolarization: Secondary | ICD-10-CM | POA: Insufficient documentation

## 2019-06-20 DIAGNOSIS — I10 Essential (primary) hypertension: Secondary | ICD-10-CM | POA: Diagnosis not present

## 2019-06-20 DIAGNOSIS — E119 Type 2 diabetes mellitus without complications: Secondary | ICD-10-CM | POA: Insufficient documentation

## 2019-06-20 DIAGNOSIS — Z01810 Encounter for preprocedural cardiovascular examination: Secondary | ICD-10-CM | POA: Insufficient documentation

## 2019-06-20 HISTORY — DX: Essential (primary) hypertension: I10

## 2019-06-20 NOTE — Telephone Encounter (Signed)
Pt called and states that he is seeing blood in his urine and wants to know if it is normal when you have a stent placed. Please advise.

## 2019-06-20 NOTE — Telephone Encounter (Signed)
Left patient a detailed message per DPR ok to have blood in UA

## 2019-06-20 NOTE — Patient Instructions (Signed)
Your procedure is scheduled ZO:XWRUon:Tues. 9/22 Report to Day Surgery. To find out your arrival time please call [phone] between 1PM - 3PM on Mon. 9/21.  Remember: Instructions that are not followed completely may result in serious medical risk,  up to and including death, or upon the discretion of your surgeon and anesthesiologist your  surgery may need to be rescheduled.     _X__ 1. Do not eat food after midnight the night before your procedure.                 No gum chewing or hard candies. You may drink clear liquids up to 2 hours                 before you are scheduled to arrive for your surgery- DO not drink clear                 liquids within 2 hours of the start of your surgery.                 Clear Liquids include:  water, Black Coffee  __X__2.  On the morning of surgery brush your teeth with toothpaste and water, you                may rinse your mouth with mouthwash if you wish.  Do not swallow any toothpaste of mouthwash.     __ 3.  No Alcohol for 24 hours before or after surgery.   ___ 4.  Do Not Smoke or use e-cigarettes For 24 Hours Prior to Your Surgery.                 Do not use any chewable tobacco products for at least 6 hours prior to                 surgery.  ____  5.  Bring all medications with you on the day of surgery if instructed.   __x__  6.  Notify your doctor if there is any change in your medical condition      (cold, fever, infections).     Do not wear jewelry, make-up, hairpins, clips or nail polish. Do not wear lotions, powders, or perfumes. You may wear deodorant. Do not shave 48 hours prior to surgery. Men may shave face and neck. Do not bring valuables to the hospital.    [facility] is not responsible for any belongings or valuables.  Contacts, dentures or bridgework may not be worn into surgery. Leave your suitcase in the car. After surgery it may be brought to your room. For patients admitted to the hospital,  discharge time is determined by your treatment team.   Patients discharged the day of surgery will not be allowed to drive home.   Please read over the following fact sheets that you were given:     _x___ Take these medicines the morning of surgery with A SIP OF WATER:    1. omeprazole (PRILOSEC OTC) 20 MG tablet  Dose night before and morning of surgery  2.   3.   4.  5.  6.  ____ Fleet Enema (as directed)   ____ Use CHG Soap as directed  ____ Use inhalers on the day of surgery  _x___ Stop metformin 2 days prior to surgery last dose on 9/19    __x__ Take 1/2 of usual insulin dose the night before surgery. No insulin the morning  of surgery.   __x__ Stopped aspirin on Sat. 9/13  ____ Stop Anti-inflammatories on    __x__ Stop supplements until after surgery.  Ascorbic Acid (VITAMIN C) 1000 MG tablet  ____ Bring C-Pap to the hospital.

## 2019-06-23 ENCOUNTER — Other Ambulatory Visit: Payer: Self-pay

## 2019-06-23 ENCOUNTER — Other Ambulatory Visit
Admission: RE | Admit: 2019-06-23 | Discharge: 2019-06-23 | Disposition: A | Discharge status: Home / Self Care | Payer: Managed Care, Other (non HMO) | Source: Ambulatory Visit | Attending: Urology | Admitting: Urology

## 2019-06-23 DIAGNOSIS — Z20828 Contact with and (suspected) exposure to other viral communicable diseases: Secondary | ICD-10-CM | POA: Insufficient documentation

## 2019-06-23 DIAGNOSIS — Z01812 Encounter for preprocedural laboratory examination: Secondary | ICD-10-CM | POA: Insufficient documentation

## 2019-06-23 LAB — SARS CORONAVIRUS 2 (TAT 6-24 HRS): SARS Coronavirus 2: NEGATIVE

## 2019-06-26 MED ORDER — CEFAZOLIN SODIUM-DEXTROSE 2-4 GM/100ML-% IV SOLN
2.0000 g | INTRAVENOUS | Status: AC
  Administered 2019-06-27: 2 g via INTRAVENOUS

## 2019-06-27 ENCOUNTER — Encounter: Payer: Self-pay | Admitting: *Deleted

## 2019-06-27 ENCOUNTER — Ambulatory Visit: Payer: Managed Care, Other (non HMO) | Admitting: Anesthesiology

## 2019-06-27 ENCOUNTER — Ambulatory Visit: Payer: Managed Care, Other (non HMO)

## 2019-06-27 ENCOUNTER — Encounter
Admission: RE | Disposition: A | Discharge status: Home / Self Care | Payer: Self-pay | Source: Home / Self Care | Attending: Urology

## 2019-06-27 ENCOUNTER — Ambulatory Visit
Admission: RE | Admit: 2019-06-27 | Discharge: 2019-06-27 | Disposition: A | Discharge status: Home / Self Care | Payer: Managed Care, Other (non HMO) | Attending: Urology | Admitting: Urology

## 2019-06-27 ENCOUNTER — Other Ambulatory Visit: Payer: Self-pay

## 2019-06-27 DIAGNOSIS — N201 Calculus of ureter: Secondary | ICD-10-CM | POA: Diagnosis not present

## 2019-06-27 DIAGNOSIS — N200 Calculus of kidney: Secondary | ICD-10-CM | POA: Diagnosis not present

## 2019-06-27 DIAGNOSIS — I10 Essential (primary) hypertension: Secondary | ICD-10-CM | POA: Insufficient documentation

## 2019-06-27 DIAGNOSIS — N400 Enlarged prostate without lower urinary tract symptoms: Secondary | ICD-10-CM | POA: Insufficient documentation

## 2019-06-27 DIAGNOSIS — Z7982 Long term (current) use of aspirin: Secondary | ICD-10-CM | POA: Insufficient documentation

## 2019-06-27 DIAGNOSIS — Z79899 Other long term (current) drug therapy: Secondary | ICD-10-CM | POA: Insufficient documentation

## 2019-06-27 DIAGNOSIS — E119 Type 2 diabetes mellitus without complications: Secondary | ICD-10-CM | POA: Insufficient documentation

## 2019-06-27 DIAGNOSIS — K219 Gastro-esophageal reflux disease without esophagitis: Secondary | ICD-10-CM | POA: Diagnosis not present

## 2019-06-27 DIAGNOSIS — Z794 Long term (current) use of insulin: Secondary | ICD-10-CM | POA: Insufficient documentation

## 2019-06-27 DIAGNOSIS — N202 Calculus of kidney with calculus of ureter: Secondary | ICD-10-CM | POA: Insufficient documentation

## 2019-06-27 HISTORY — PX: CYSTOSCOPY/URETEROSCOPY/HOLMIUM LASER/STENT PLACEMENT: SHX6546

## 2019-06-27 LAB — GLUCOSE, CAPILLARY
Glucose-Capillary: 182 mg/dL — ABNORMAL HIGH (ref 70–99)
Glucose-Capillary: 147 mg/dL — ABNORMAL HIGH (ref 70–99)

## 2019-06-27 SURGERY — CYSTOSCOPY/URETEROSCOPY/HOLMIUM LASER/STENT PLACEMENT
Anesthesia: General | Laterality: Left

## 2019-06-27 MED ORDER — ONDANSETRON HCL 4 MG/2ML IJ SOLN
INTRAMUSCULAR | Status: DC | PRN
  Administered 2019-06-27: 4 mg via INTRAVENOUS

## 2019-06-27 MED ORDER — OXYBUTYNIN CHLORIDE 5 MG PO TABS: ORAL_TABLET | ORAL | 0 refills | Status: DC

## 2019-06-27 MED ORDER — LIDOCAINE HCL (CARDIAC) PF 100 MG/5ML IV SOSY
PREFILLED_SYRINGE | INTRAVENOUS | Status: DC | PRN
  Administered 2019-06-27: 60 mg via INTRAVENOUS

## 2019-06-27 MED ORDER — DEXAMETHASONE SODIUM PHOSPHATE 10 MG/ML IJ SOLN
INTRAMUSCULAR | Status: AC
  Filled 2019-06-27: qty 1

## 2019-06-27 MED ORDER — DEXAMETHASONE SODIUM PHOSPHATE 10 MG/ML IJ SOLN
INTRAMUSCULAR | Status: DC | PRN
  Administered 2019-06-27: 4 mg via INTRAVENOUS

## 2019-06-27 MED ORDER — CEFAZOLIN SODIUM-DEXTROSE 2-4 GM/100ML-% IV SOLN
INTRAVENOUS | Status: AC
  Filled 2019-06-27: qty 100

## 2019-06-27 MED ORDER — PROPOFOL 10 MG/ML IV BOLUS
INTRAVENOUS | Status: DC | PRN
  Administered 2019-06-27: 150 mg via INTRAVENOUS

## 2019-06-27 MED ORDER — ONDANSETRON HCL 4 MG/2ML IJ SOLN
INTRAMUSCULAR | Status: AC
  Administered 2019-06-27: 12:00:00 4 mg via INTRAVENOUS
  Filled 2019-06-27: qty 2

## 2019-06-27 MED ORDER — FENTANYL CITRATE (PF) 100 MCG/2ML IJ SOLN
INTRAMUSCULAR | Status: AC
  Filled 2019-06-27: qty 2

## 2019-06-27 MED ORDER — IOHEXOL 180 MG/ML  SOLN
INTRAMUSCULAR | Status: DC | PRN
  Administered 2019-06-27: 11:00:00 15 mL

## 2019-06-27 MED ORDER — SUGAMMADEX SODIUM 200 MG/2ML IV SOLN
INTRAVENOUS | Status: AC
  Filled 2019-06-27: qty 2

## 2019-06-27 MED ORDER — ONDANSETRON HCL 4 MG/2ML IJ SOLN
INTRAMUSCULAR | Status: AC
  Filled 2019-06-27: qty 2

## 2019-06-27 MED ORDER — SUCCINYLCHOLINE CHLORIDE 20 MG/ML IJ SOLN
INTRAMUSCULAR | Status: DC | PRN
  Administered 2019-06-27: 140 mg via INTRAVENOUS

## 2019-06-27 MED ORDER — ROCURONIUM BROMIDE 50 MG/5ML IV SOLN
INTRAVENOUS | Status: AC
  Filled 2019-06-27: qty 1

## 2019-06-27 MED ORDER — FENTANYL CITRATE (PF) 100 MCG/2ML IJ SOLN: 25.0000 ug | INTRAMUSCULAR | Status: DC | PRN

## 2019-06-27 MED ORDER — FENTANYL CITRATE (PF) 100 MCG/2ML IJ SOLN
INTRAMUSCULAR | Status: DC | PRN
  Administered 2019-06-27: 50 ug via INTRAVENOUS
  Administered 2019-06-27: 25 ug via INTRAVENOUS
  Administered 2019-06-27 (×2): 50 ug via INTRAVENOUS
  Administered 2019-06-27: 25 ug via INTRAVENOUS

## 2019-06-27 MED ORDER — MIDAZOLAM HCL 2 MG/2ML IJ SOLN
INTRAMUSCULAR | Status: AC
  Filled 2019-06-27: qty 2

## 2019-06-27 MED ORDER — SODIUM CHLORIDE 0.9 % IV SOLN
INTRAVENOUS | Status: DC
  Administered 2019-06-27 (×2): via INTRAVENOUS

## 2019-06-27 MED ORDER — SUCCINYLCHOLINE CHLORIDE 20 MG/ML IJ SOLN
INTRAMUSCULAR | Status: AC
  Filled 2019-06-27: qty 1

## 2019-06-27 MED ORDER — MIDAZOLAM HCL 2 MG/2ML IJ SOLN
INTRAMUSCULAR | Status: DC | PRN
  Administered 2019-06-27: 2 mg via INTRAVENOUS

## 2019-06-27 MED ORDER — HYDROCODONE-ACETAMINOPHEN 5-325 MG PO TABS: 1.0000 | ORAL_TABLET | ORAL | 0 refills | Status: DC | PRN

## 2019-06-27 MED ORDER — OXYCODONE HCL 5 MG/5ML PO SOLN: 5.0000 mg | Freq: Once | ORAL | Status: DC | PRN

## 2019-06-27 MED ORDER — PROPOFOL 10 MG/ML IV BOLUS
INTRAVENOUS | Status: AC
  Filled 2019-06-27: qty 20

## 2019-06-27 MED ORDER — OXYCODONE HCL 5 MG PO TABS: 5.0000 mg | ORAL_TABLET | Freq: Once | ORAL | Status: DC | PRN

## 2019-06-27 MED ORDER — ONDANSETRON HCL 4 MG/2ML IJ SOLN
4.0000 mg | Freq: Once | INTRAMUSCULAR | Status: AC
  Administered 2019-06-27: 12:00:00 4 mg via INTRAVENOUS

## 2019-06-27 MED ORDER — SUGAMMADEX SODIUM 200 MG/2ML IV SOLN
INTRAVENOUS | Status: DC | PRN
  Administered 2019-06-27: 200 mg via INTRAVENOUS

## 2019-06-27 MED ORDER — LIDOCAINE HCL (PF) 2 % IJ SOLN
INTRAMUSCULAR | Status: AC
  Filled 2019-06-27: qty 10

## 2019-06-27 MED ORDER — ROCURONIUM BROMIDE 100 MG/10ML IV SOLN
INTRAVENOUS | Status: DC | PRN
  Administered 2019-06-27 (×2): 10 mg via INTRAVENOUS
  Administered 2019-06-27: 15 mg via INTRAVENOUS
  Administered 2019-06-27: 5 mg via INTRAVENOUS
  Administered 2019-06-27 (×2): 10 mg via INTRAVENOUS

## 2019-06-27 SURGICAL SUPPLY — 29 items
BAG DRAIN CYSTO-URO LG1000N (MISCELLANEOUS) ×3 IMPLANT
BASKET ZERO TIP 1.9FR (BASKET) ×3 IMPLANT
BRUSH SCRUB EZ 1% IODOPHOR (MISCELLANEOUS) ×3 IMPLANT
CATH URETL 5X70 OPEN END (CATHETERS) IMPLANT
CNTNR SPEC 2.5X3XGRAD LEK (MISCELLANEOUS) ×1
CONT SPEC 4OZ STER OR WHT (MISCELLANEOUS) ×2
CONTAINER SPEC 2.5X3XGRAD LEK (MISCELLANEOUS) ×1 IMPLANT
DRAPE UTILITY 15X26 TOWEL STRL (DRAPES) ×3 IMPLANT
FIBER LASER TRAC TIP (UROLOGICAL SUPPLIES) ×3 IMPLANT
GLOVE BIO SURGEON STRL SZ8 (GLOVE) ×3 IMPLANT
GOWN STRL REUS W/ TWL LRG LVL3 (GOWN DISPOSABLE) ×1 IMPLANT
GOWN STRL REUS W/ TWL XL LVL3 (GOWN DISPOSABLE) ×1 IMPLANT
GOWN STRL REUS W/TWL LRG LVL3 (GOWN DISPOSABLE) ×2
GOWN STRL REUS W/TWL XL LVL3 (GOWN DISPOSABLE) ×2
GUIDEWIRE STR DUAL SENSOR (WIRE) ×3 IMPLANT
INFUSOR MANOMETER BAG 3000ML (MISCELLANEOUS) ×3 IMPLANT
INTRODUCER DILATOR DOUBLE (INTRODUCER) ×3 IMPLANT
KIT TURNOVER CYSTO (KITS) ×3 IMPLANT
PACK CYSTO AR (MISCELLANEOUS) ×3 IMPLANT
SET CYSTO W/LG BORE CLAMP LF (SET/KITS/TRAYS/PACK) ×3 IMPLANT
SHEATH URETERAL 12FR 45CM (SHEATH) ×3 IMPLANT
SHEATH URETERAL 12FRX35CM (MISCELLANEOUS) IMPLANT
SOL .9 NS 3000ML IRR  AL (IV SOLUTION) ×2
SOL .9 NS 3000ML IRR UROMATIC (IV SOLUTION) ×1 IMPLANT
STENT URET 6FRX24 CONTOUR (STENTS) IMPLANT
STENT URET 6FRX26 CONTOUR (STENTS) ×3 IMPLANT
SURGILUBE 2OZ TUBE FLIPTOP (MISCELLANEOUS) ×3 IMPLANT
VALVE UROSEAL ADJ ENDO (VALVE) ×3 IMPLANT
WATER STERILE IRR 1000ML POUR (IV SOLUTION) ×3 IMPLANT

## 2019-06-27 NOTE — Discharge Instructions (Signed)
AMBULATORY SURGERY  DISCHARGE INSTRUCTIONS   1) The drugs that you were given will stay in your system until tomorrow so for the next 24 hours you should not:  A) Drive an automobile B) Make any legal decisions C) Drink any alcoholic beverage   2) You may resume regular meals tomorrow.  Today it is better to start with liquids and gradually work up to solid foods.  You may eat anything you prefer, but it is better to start with liquids, then soup and crackers, and gradually work up to solid foods.   3) Please notify your doctor immediately if you have any unusual bleeding, trouble breathing, redness and pain at the surgery site, drainage, fever, or pain not relieved by medication.    4) Additional Instructions:        Please contact your physician with any problems or Same Day Surgery at [phone], Monday through Friday 6 am to 4 pm, or [place] at [facility] number at [phone].AMBULATORY SURGERY  DISCHARGE INSTRUCTIONS   5) The drugs that you were given will stay in your system until tomorrow so for the next 24 hours you should not:  D) Drive an automobile E) Make any legal decisions F) Drink any alcoholic beverage   6) You may resume regular meals tomorrow.  Today it is better to start with liquids and gradually work up to solid foods.  You may eat anything you prefer, but it is better to start with liquids, then soup and crackers, and gradually work up to solid foods.   7) Please notify your doctor immediately if you have any unusual bleeding, trouble breathing, redness and pain at the surgery site, drainage, fever, or pain not relieved by medication.    8) Additional Instructions:        Please contact your physician with any problems or Same Day Surgery at [phone], Monday through Friday 6 am to 4 pm, or [place] at [facility] number at [phone].DISCHARGE INSTRUCTIONS FOR KIDNEY STONE/URETERAL STENT   MEDICATIONS:  1. Resume  all your other meds from home.  2.  AZO (over-the-counter) can help with the burning/stinging when you urinate. 3.  Hydrocodone is for moderate/severe pain, Rx was sent to your pharmacy. 4.  Refill for oxybutynin was sent to pharmacy for stent irritation if needed  ACTIVITY:  1. May resume regular activities in 24 hours. 2. No driving while on narcotic pain medications  3. Drink plenty of water  4. Continue to walk at home - you can still get blood clots when you are at home, so keep active, but don't over do it.  5. May return to work/school tomorrow or when you feel ready   BATHING:  1. You can shower.  SIGNS/SYMPTOMS TO CALL:  Please call us if you have a fever greater than 101.5, uncontrolled nausea/vomiting, uncontrolled pain, dizziness, unable to urinate, bloody urine, chest pain, shortness of breath, leg swelling, leg pain, or any other concerns or questions.   You can reach [place] at [phone].   FOLLOW-UP:  1. You have a follow-up appointment scheduled 07/07/2019 for stent removal

## 2019-06-27 NOTE — Anesthesia Post-op Follow-up Note (Cosign Needed)
Anesthesia QCDR form completed.        

## 2019-06-27 NOTE — Anesthesia Procedure Notes (Cosign Needed)
Procedure Name: Intubation Date/Time: 06/27/2019 9:33 AM Performed by: Cory Munch, RN Pre-anesthesia Checklist: Patient identified, Emergency Drugs available, Suction available and Patient being monitored Patient Re-evaluated:Patient Re-evaluated prior to induction Oxygen Delivery Method: Circle system utilized Preoxygenation: Pre-oxygenation with 100% oxygen Induction Type: IV induction Ventilation: Mask ventilation without difficulty and Oral airway inserted - appropriate to patient size Laryngoscope Size: Mac and 3 Grade View: Grade II Tube type: Oral Tube size: 7.5 mm Number of attempts: 1 Airway Equipment and Method: Stylet and Oral airway Placement Confirmation: ETT inserted through vocal cords under direct vision,  positive ETCO2 and breath sounds checked- equal and bilateral Secured at: 22 cm Tube secured with: Tape Dental Injury: Teeth and Oropharynx as per pre-operative assessment

## 2019-06-27 NOTE — Transfer of Care (Signed)
Immediate Anesthesia Transfer of Care Note  Patient: Brandon [name]  Procedure(s) Performed: CYSTOSCOPY/URETEROSCOPY/HOLMIUM LASER/STENT Exchange (Left )  Patient Location: PACU  Anesthesia Type:General  Level of Consciousness: sedated and drowsy  Airway & Oxygen Therapy: Patient Spontanous Breathing and Patient connected to face mask oxygen  Post-op Assessment: Report given to RN and Post -op Vital signs reviewed and stable  Post vital signs: Reviewed and stable  Last Vitals:  Vitals Value Taken Time  BP 122/71 06/27/19 1124  Temp 36.2 C 06/27/19 1124  Pulse 73 06/27/19 1126  Resp 17 06/27/19 1126  SpO2 99 % 06/27/19 1126  Vitals shown include unvalidated device data.  Last Pain:  Vitals:   06/27/19 1124  TempSrc:   PainSc: Asleep         Complications: No apparent anesthesia complications

## 2019-06-27 NOTE — Interval H&P Note (Signed)
History and Physical Interval Note:  06/27/2019 9:07 AM  Brandon [name]  has presented today for surgery, with the diagnosis of left ureteral and renal calculi.  The various methods of treatment have been discussed with the patient and family. After consideration of risks, benefits and other options for treatment, the patient has consented to  Procedure(s): CYSTOSCOPY/URETEROSCOPY/HOLMIUM LASER/STENT Exchange (Left) as a surgical intervention.  The patient's history has been reviewed, patient examined, no change in status, stable for surgery.  I have reviewed the patient's chart and labs.  Questions were answered to the patient's satisfaction.     [place]

## 2019-06-27 NOTE — OR Nursing (Signed)
44fr x26 stent removed from left ureter

## 2019-06-27 NOTE — Anesthesia Preprocedure Evaluation (Addendum)
Anesthesia Evaluation  Patient identified by MRN, date of birth, ID band Patient awake    Reviewed: Allergy & Precautions, H&P , NPO status , Patient's Chart, lab work & pertinent test results  History of Anesthesia Complications Negative for: history of anesthetic complications  Airway Mallampati: II  TM Distance: >3 FB Neck ROM: full    Dental  (+) Chipped   Pulmonary neg pulmonary ROS, neg shortness of breath,           Cardiovascular Exercise Tolerance: Good hypertension, (-) angina(-) Past MI and (-) DOE      Neuro/Psych negative neurological ROS  negative psych ROS   GI/Hepatic Neg liver ROS, GERD  Medicated and Controlled,  Endo/Other  diabetes, Type 2, Insulin Dependent  Renal/GU Renal disease     Musculoskeletal   Abdominal   Peds  Hematology negative hematology ROS (+)   Anesthesia Other Findings Past Medical History: No date: BPH (benign prostatic hyperplasia) No date: Diabetes mellitus without complication (HCC)     Comment:  type 2 No date: GERD (gastroesophageal reflux disease) No date: History of kidney stones     Comment:  required lithotripsy around 2010, sees Dr. Bernardo Heater No date: Hypertension  Past Surgical History: No date: CHOLECYSTECTOMY 04/21/2019: COLONOSCOPY WITH PROPOFOL; N/A     Comment:  Procedure: COLONOSCOPY WITH PROPOFOL;  Surgeon: Toledo,               Benay Pike, MD;  Location: [facility] ENDOSCOPY;  Service:               Endoscopy;  Laterality: N/A; 05/21/2019: CYSTOSCOPY W/ URETERAL STENT PLACEMENT; Left     Comment:  Procedure: CYSTOSCOPY WITH RETROGRADE PYELOGRAM/URETERAL              STENT PLACEMENT;  Surgeon: Irine Seal, MD;  Location:               [facility] ORS;  Service: Urology;  Laterality: Left; 05/18/2019: EXTRACORPOREAL SHOCK WAVE LITHOTRIPSY; Left     Comment:  Procedure: EXTRACORPOREAL SHOCK WAVE LITHOTRIPSY (ESWL);              Surgeon: Billey Co, MD;  Location:  [facility] ORS;                Service: Urology;  Laterality: Left; No date: HERNIA REPAIR No date: TONSILLECTOMY     Reproductive/Obstetrics negative OB ROS                             Anesthesia Physical Anesthesia Plan  ASA: III  Anesthesia Plan: General ETT   Post-op Pain Management:    Induction: Intravenous  PONV Risk Score and Plan: Dexamethasone, Ondansetron, Midazolam and Treatment may vary due to age or medical condition  Airway Management Planned: Oral ETT  Additional Equipment:   Intra-op Plan:   Post-operative Plan: Extubation in OR  Informed Consent: I have reviewed the patients History and Physical, chart, labs and discussed the procedure including the risks, benefits and alternatives for the proposed anesthesia with the patient or authorized representative who has indicated his/her understanding and acceptance.     Dental Advisory Given  Plan Discussed with: Anesthesiologist, CRNA and Surgeon  Anesthesia Plan Comments: (Patient consented for risks of anesthesia including but not limited to:  - adverse reactions to medications - damage to teeth, lips or other oral mucosa - sore throat or hoarseness - Damage to heart, brain, lungs or loss of life  Patient voiced understanding.)       Anesthesia Quick Evaluation

## 2019-06-27 NOTE — Op Note (Signed)
Preoperative diagnosis:  1.  Left proximal ureteral calculus 2.  Left nephrolithiasis  Postoperative diagnosis:  Same  Procedure:  1. Cystoscopy 2. Left ureteroscopy and stone removal 3. Ureteroscopic laser lithotripsy 4. Left ureteral stent placement (6FR) 26 cm 5. Left retrograde pyelography with interpretation  Surgeon: Lorin Picket C. , M.D.  Anesthesia: General  Complications: None  Intraoperative findings:  1.  Left retrograde pyelography post procedure showed no filling defects, stone fragments or contrast extravasation  EBL: Minimal  Specimens: 1. Calculus fragments for analysis   Indication: ELZIA HOTT is a 63 y.o. year old patient status post failed shockwave lithotripsy of a left proximal ureteral calculus on 05/18/2019.  He had significant renal colic postoperatively and underwent urgent stent placement for obstruction on 05/21/2019.  He presents for definitive stone treatment and treatment of his left nonobstructing renal calculi.  Reviewing the management options for treatment, the patient elected to proceed with the above surgical procedure(s). We have discussed the potential benefits and risks of the procedure, side effects of the proposed treatment, the likelihood of the patient achieving the goals of the procedure, and any potential problems that might occur during the procedure or recuperation. Informed consent has been obtained.  Description of procedure:  The patient was taken to the operating room and general anesthesia was induced.  The patient was placed in the dorsal lithotomy position, prepped and draped in the usual sterile fashion, and preoperative antibiotics were administered. A preoperative time-out was performed.   A 21 French cystoscope was lubricated and passed under direct vision.  The urethra was normal in caliber without stricture.  The prostate demonstrated moderate lateral lobe enlargement.  Panendoscopy was performed and the bladder mucosa  showed no erythema, solid or papillary lesions.  The left ureteral stent was grasped with endoscopic forceps and brought out to the urethral meatus.  A 0.038 Sensor wire was placed through the stent and advanced to the renal pelvis under fluoroscopic guidance.  A dual-lumen catheter was then placed over the safety wire and a second sensor wire was placed in a similar fashion.  A 12/14 French ureteral access sheath was placed over the working wire under fluoroscopic guidance without difficulty to a level just below the ureteral calculus as seen on fluoroscopy.  A digital flexible ureteroscope was placed through the access sheath and the partially fragmented ureteral calculus was identified.    A 200 micron holmium laser fiber was placed through the ureteroscope and the stone was dusted/fragmented at a setting of 0.2 J and frequency of 20 hz.   With treatment the calculus migrated to the kidney and was identified and a lower pole calyx along with approximately 10 mm calyceal calculus.  Both were dusted with the holmium laser at 0.2/40 Hz.  All larger stone fragments were then removed from the collecting system with a zero tip nitinol basket.  Retrograde pyelogram was performed and each calyx was sequentially examined under fluoroscopic guidance and no significant size fragments were identified.  The ureteral access sheath and ureteroscope were removed in tandem and the ureter showed no evidence of injury or perforation.  A 6 FR/26 CM stent was was placed under fluoroscopic guidance.  The wire was then removed with an adequate stent curl noted in the renal pelvis as well as in the bladder.  The bladder was then emptied and the procedure ended.  The patient appeared to tolerate the procedure well and without complications.  After anesthetic reversal the patient was transported to the PACU  in stable condition.   Plan: He is scheduled for cystoscopy with stent removal on 07/07/2019.  John Giovanni,  MD

## 2019-06-28 ENCOUNTER — Encounter: Payer: Self-pay | Admitting: Urology

## 2019-06-28 ENCOUNTER — Other Ambulatory Visit: Payer: Managed Care, Other (non HMO) | Admitting: Urology

## 2019-06-28 NOTE — Anesthesia Postprocedure Evaluation (Signed)
Anesthesia Post Note  Patient: Brandon [name]  Procedure(s) Performed: CYSTOSCOPY/URETEROSCOPY/HOLMIUM LASER/STENT Exchange (Left )  Patient location during evaluation: PACU Anesthesia Type: General Level of consciousness: awake and alert Pain management: pain level controlled Vital Signs Assessment: post-procedure vital signs reviewed and stable Respiratory status: spontaneous breathing, nonlabored ventilation, respiratory function stable and patient connected to nasal cannula oxygen Cardiovascular status: blood pressure returned to baseline and stable Postop Assessment: no apparent nausea or vomiting Anesthetic complications: no     Last Vitals:  Vitals:   06/27/19 1220 06/27/19 1245  BP: 113/86 114/80  Pulse: 69   Resp: 20 20  Temp: (!) 36.1 C   SpO2: 98% 98%    Last Pain:  Vitals:   06/28/19 0831  TempSrc:   PainSc: 4                  Shaquille Janes S

## 2019-07-05 LAB — CALCULI, WITH PHOTOGRAPH (CLINICAL LAB)
Calcium Oxalate Monohydrate: 100 %
Weight Calculi: 207 mg

## 2019-07-07 ENCOUNTER — Other Ambulatory Visit: Payer: Self-pay

## 2019-07-07 ENCOUNTER — Ambulatory Visit (INDEPENDENT_AMBULATORY_CARE_PROVIDER_SITE_OTHER): Payer: Managed Care, Other (non HMO) | Admitting: Urology

## 2019-07-07 ENCOUNTER — Encounter: Payer: Self-pay | Admitting: Urology

## 2019-07-07 VITALS — BP 120/78 | HR 79 | Ht 70.0 in | Wt 228.8 lb

## 2019-07-07 DIAGNOSIS — N200 Calculus of kidney: Secondary | ICD-10-CM | POA: Diagnosis not present

## 2019-07-07 MED ORDER — CIPROFLOXACIN HCL 500 MG PO TABS
500.0000 mg | ORAL_TABLET | Freq: Once | ORAL | Status: AC
  Administered 2019-07-07: 500 mg via ORAL

## 2019-07-07 MED ORDER — LIDOCAINE HCL URETHRAL/MUCOSAL 2 % EX GEL
1.0000 "application " | Freq: Once | CUTANEOUS | Status: AC
  Administered 2019-07-07: 1 via URETHRAL

## 2019-07-07 NOTE — Patient Instructions (Signed)

## 2019-07-07 NOTE — Progress Notes (Signed)
Indications: Patient is 63 y.o., male who recently underwent ureteroscopic removal of a left ureteral/renal calculi.  He had no postoperative complaints other than moderate stent irritative symptoms.  The patient is presenting today for stent removal.  Procedure:  Flexible Cystoscopy with stent removal (81771)  Timeout was performed and the correct patient, procedure and participants were identified.    Description:  The patient was prepped and draped in the usual sterile fashion. Flexible cystosopy was performed.  The stent was visualized, grasped, and removed intact without difficulty. The patient tolerated the procedure well.  A single dose of oral antibiotics was given.  Complications:  None  Plan: Stone analysis was calcium oxalate monohydrate.  I have recommended a metabolic evaluation to include blood work and a 24-hour urine study.  Follow-up visit with KUB 4 months.   John Giovanni, MD

## 2019-07-09 ENCOUNTER — Encounter: Payer: Self-pay | Admitting: Urology

## 2019-07-10 ENCOUNTER — Telehealth: Payer: Self-pay | Admitting: Urology

## 2019-07-10 NOTE — Telephone Encounter (Signed)
App made and mailed °

## 2019-07-10 NOTE — Telephone Encounter (Signed)
-----   Message from Abbie Sons, MD sent at 07/09/2019 11:54 AM EDT ----- Regarding: Follow-up Please schedule follow-up visit with KUB 4 months.

## 2019-07-17 ENCOUNTER — Other Ambulatory Visit: Payer: Managed Care, Other (non HMO)

## 2019-07-17 ENCOUNTER — Other Ambulatory Visit: Payer: Self-pay

## 2019-11-13 ENCOUNTER — Ambulatory Visit: Payer: Managed Care, Other (non HMO) | Admitting: Urology

## 2019-11-15 IMAGING — CT CT RENAL STONE PROTOCOL
2 of 4 series · 16 of 46 positions shown, 18 images · non-contrast
Comparison: 05/15/2019

CLINICAL DATA: Leg pain with recurrent stone disease suspected

EXAM:
CT ABDOMEN AND PELVIS WITHOUT CONTRAST
TECHNIQUE: Multidetector CT imaging of the abdomen and pelvis was performed
following the standard protocol without IV contrast.

[Series 2: stone full standard · axial · 0.82mm/px · z∈[-1162,-632]mm · 13 of 118 slices shown, 15 images]
[im 6/118  soft-tissue]
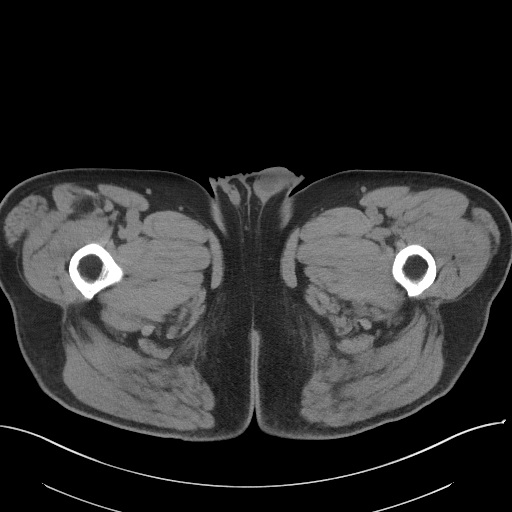
[im 6/118  bone]
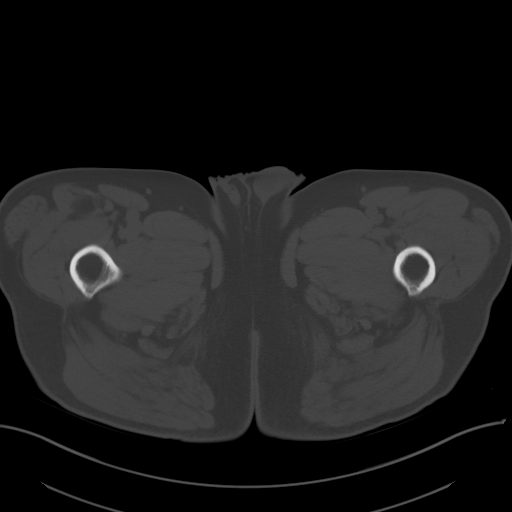
[im 16/118  soft-tissue]
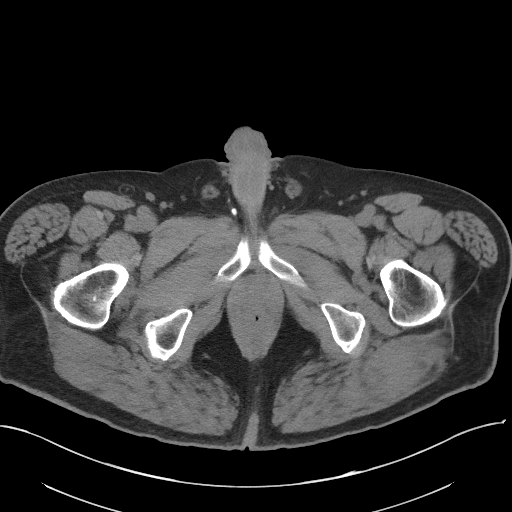
[im 26/118  soft-tissue]
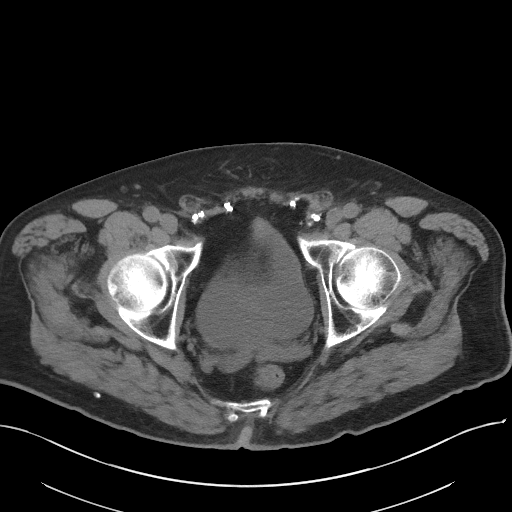
[im 31/118  soft-tissue]
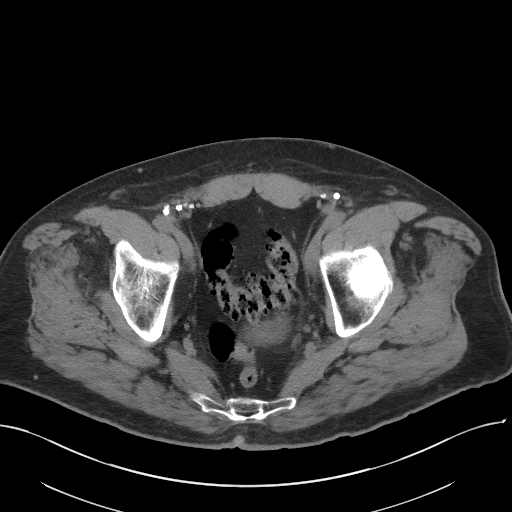
[im 41/118  soft-tissue]
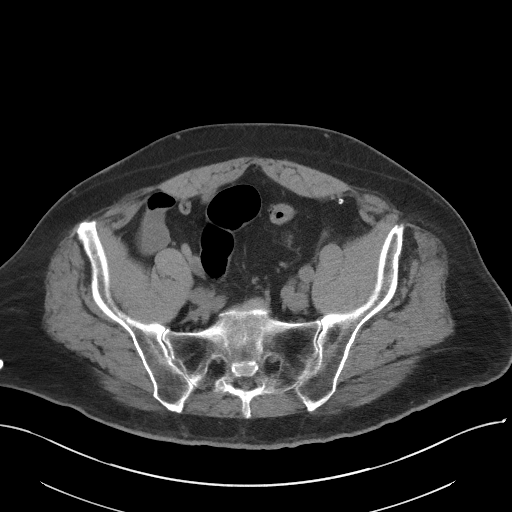
[im 51/118  soft-tissue]
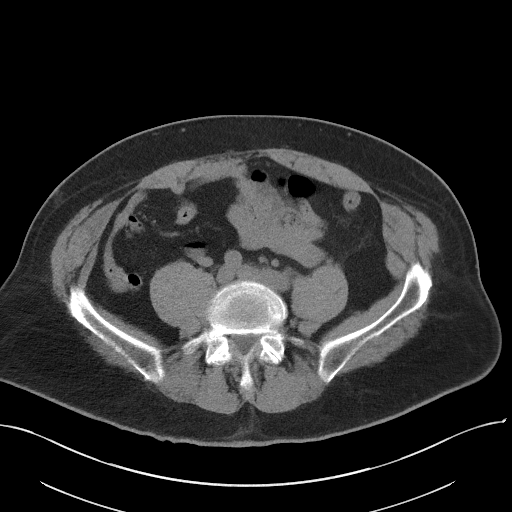
[im 62/118  soft-tissue]
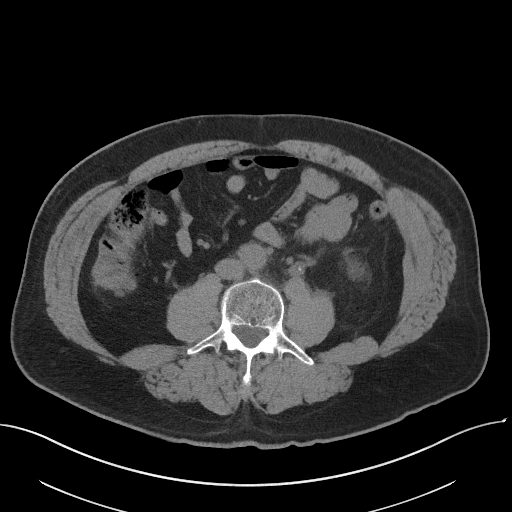
[im 67/118  soft-tissue]
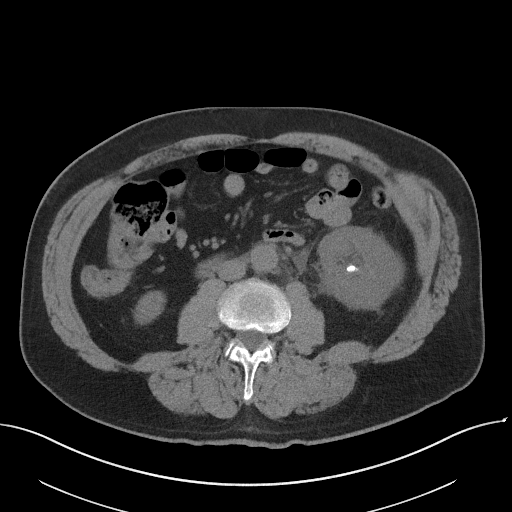
[im 77/118  soft-tissue]
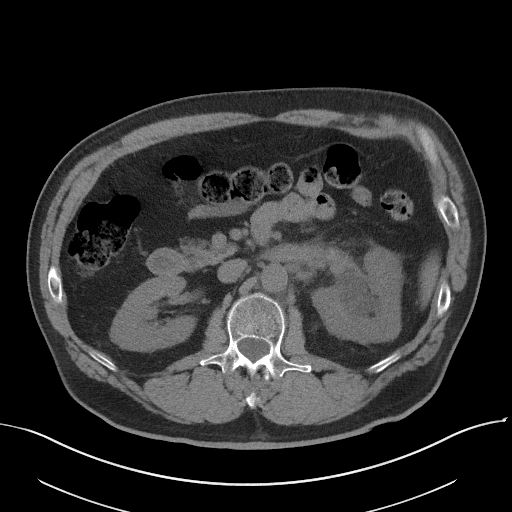
[im 77/118  bone]
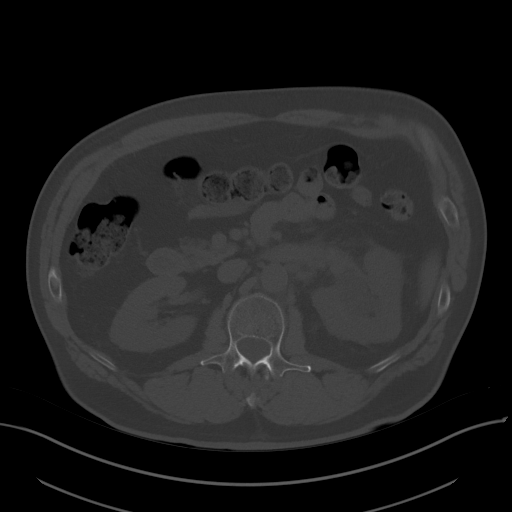
[im 87/118  soft-tissue]
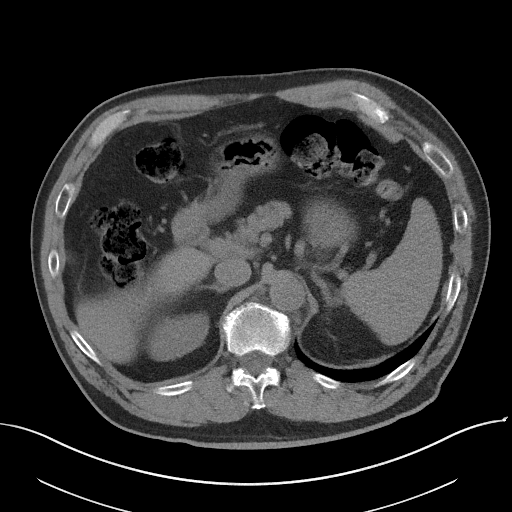
[im 92/118  soft-tissue]
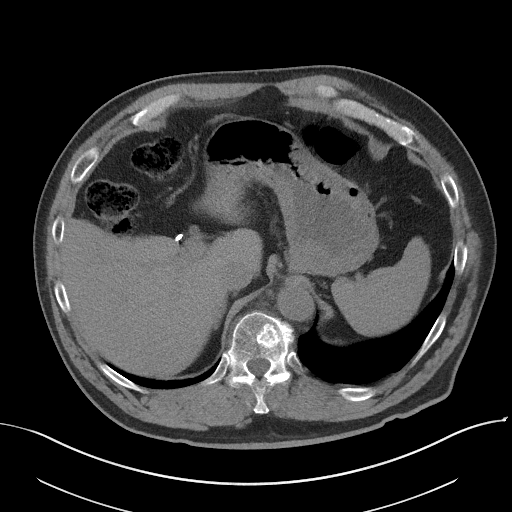
[im 102/118  soft-tissue]
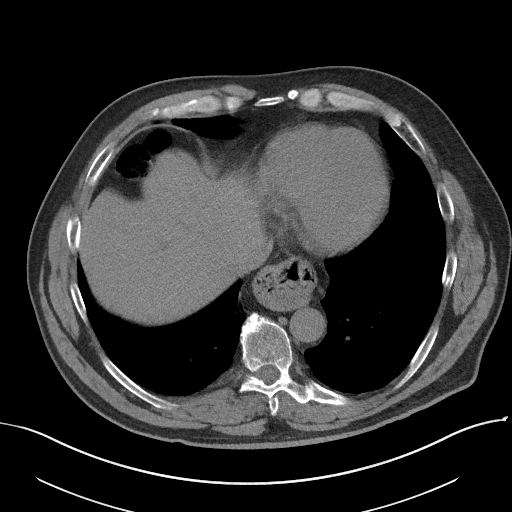
[im 112/118  soft-tissue]
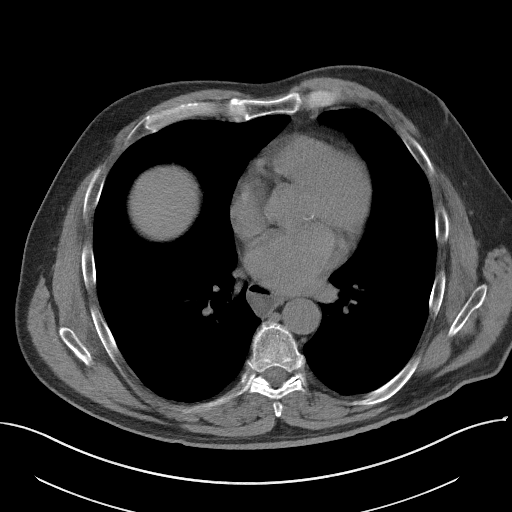

[Series 5: coronal · coronal · 0.83mm/px · 3 of 150 slices shown]
[im 50/150  soft-tissue]
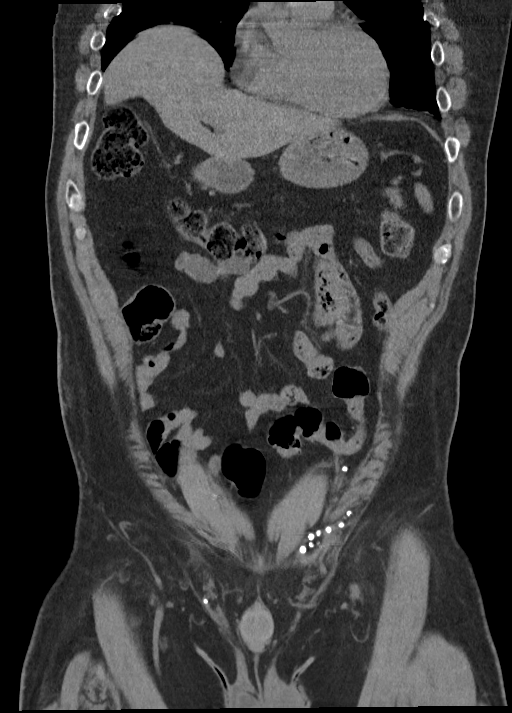
[im 67/150  soft-tissue]
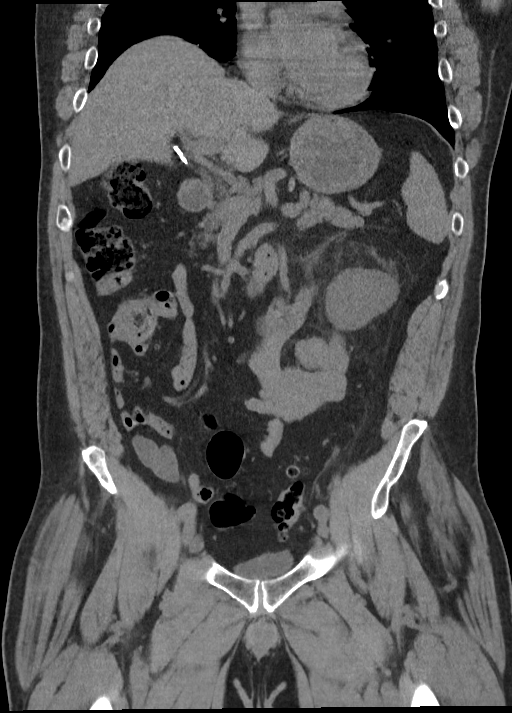
[im 83/150  soft-tissue]
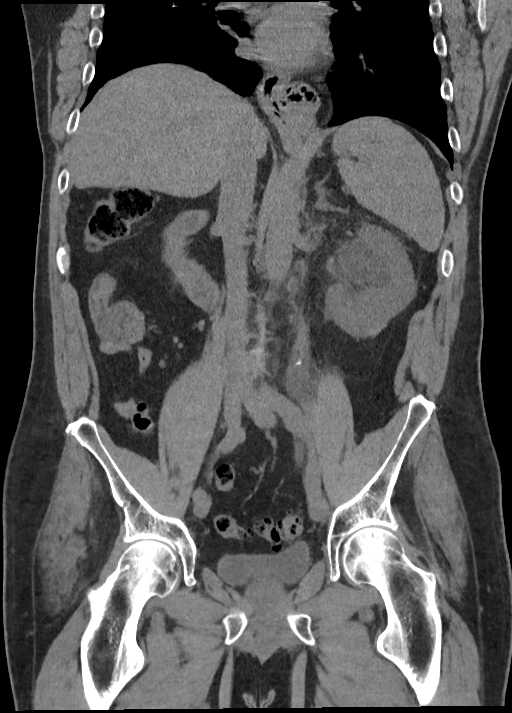

[16 of 46 positions shown; findings below may reference images not displayed]

FINDINGS: Lower chest: Small sliding hiatal hernia with lower esophageal fluid
level.

Hepatobiliary: No focal liver abnormality.Cholecystectomy. No bile
duct dilatation.

Pancreas: Unremarkable.

Spleen: Unremarkable.

Adrenals/Urinary Tract: Negative adrenals. Interval fragmentation of
the left ureteral stone with the largest conglomerate measuring 10 x
3 mm in the mid ureter. Just proximal is a 2 mm calculus.

Punctate right and 3 left renal calculi with left-sided stone
measuring up to 11 mm.

Unremarkable bladder.

Stomach/Bowel: No obstruction. No appendicitis. Sigmoid
diverticulosis.

Vascular/Lymphatic: No acute vascular abnormality. No mass or
adenopathy.

Reproductive:Enlarged prostate with nonspecific right-sided
calcification.

Other: No ascites or pneumoperitoneum. Bilateral inguinal hernia
repair

Musculoskeletal: No acute abnormalities.
IMPRESSION: 1. Left urinary obstruction from partially fragmented mid ureteral
calculus with the largest stone/conglomerate measuring 10 x 3 mm.
2. Left more than right nephrolithiasis.

## 2019-12-07 ENCOUNTER — Ambulatory Visit: Payer: Managed Care, Other (non HMO) | Admitting: Urology

## 2019-12-27 NOTE — Progress Notes (Addendum)
12/28/2019 01:15 PM   Brandon [name] [birth year] [id number]  Referring provider: Dion Body, MD [facility] [place],  [place] [address]  Chief Complaint  Patient presents with  . Nephrolithiasis    HPI: 64 yo male presents for follow-up of nephrolithiasis  -Removal of a left ureteral/renal calculi on 06/2019 -Stent removal preformed 07/2019 -no complaints from stent removal procedure -24 hr urine kit completed but results have not been received -dx with osteroporosis, recently started 600 mg 2x daily of calcium citrate -large amount of stool and bowel gas obscuring the renal outlining and faint calciufcations stones in the right kidney from KUB   PMH: Past Medical History:  Diagnosis Date  . BPH (benign prostatic hyperplasia)   . Diabetes mellitus without complication ([place])    type 2  . GERD (gastroesophageal reflux disease)   . History of kidney stones    required lithotripsy around 2010, sees Dr. Bernardo Heater  . Hypertension     Surgical History: Past Surgical History:  Procedure Laterality Date  . CHOLECYSTECTOMY    . COLONOSCOPY WITH PROPOFOL N/A 04/21/2019   Procedure: COLONOSCOPY WITH PROPOFOL;  Surgeon: Toledo, Benay Pike, MD;  Location: [facility] ENDOSCOPY;  Service: Endoscopy;  Laterality: N/A;  . CYSTOSCOPY W/ URETERAL STENT PLACEMENT Left 05/21/2019   Procedure: CYSTOSCOPY WITH RETROGRADE PYELOGRAM/URETERAL STENT PLACEMENT;  Surgeon: Irine Seal, MD;  Location: [facility] ORS;  Service: Urology;  Laterality: Left;  . CYSTOSCOPY/URETEROSCOPY/HOLMIUM LASER/STENT PLACEMENT Left 06/27/2019   Procedure: CYSTOSCOPY/URETEROSCOPY/HOLMIUM LASER/STENT Exchange;  Surgeon: Abbie Sons, MD;  Location: [facility] ORS;  Service: Urology;  Laterality: Left;  . EXTRACORPOREAL SHOCK WAVE LITHOTRIPSY Left 05/18/2019   Procedure: EXTRACORPOREAL SHOCK WAVE LITHOTRIPSY (ESWL);  Surgeon: Billey Co, MD;  Location: [facility] ORS;  Service: Urology;  Laterality: Left;   . HERNIA REPAIR    . TONSILLECTOMY      Home Medications:  Allergies as of 12/28/2019   No Known Allergies     Medication List       Accurate as of December 28, 2019  1:41 PM. If you have any questions, ask your nurse or doctor.        alendronate 70 MG tablet Commonly known as: FOSAMAX Take by mouth.   aspirin EC 81 MG tablet Take 81 mg by mouth daily.   benzonatate 200 MG capsule Commonly known as: TESSALON Take by mouth.   HumaLOG KwikPen 100 UNIT/ML KwikPen Generic drug: insulin lispro Inject 8-10 Units into the skin 3 (three) times daily with meals.   Lantus SoloStar 100 UNIT/ML Solostar Pen Generic drug: insulin glargine Inject 50 Units into the skin at bedtime.   lisinopril 5 MG tablet Commonly known as: ZESTRIL Take 5 mg by mouth daily.   metFORMIN 500 MG tablet Commonly known as: GLUCOPHAGE Take 500 mg by mouth 2 (two) times daily.   Multi-Vitamins Tabs Take 1 tablet by mouth daily.   OneTouch Delica Plus [id number] Misc Take by mouth.   OneTouch Ultra test strip Generic drug: glucose blood Use 4 (four) times daily   pravastatin 20 MG tablet Commonly known as: PRAVACHOL Take 20 mg by mouth at bedtime.   PriLOSEC OTC 20 MG tablet Generic drug: omeprazole Take 20 mg by mouth daily.   vitamin C 1000 MG tablet Take 1,000 mg by mouth daily.       Allergies: No Known Allergies  Family History: Family History  Problem Relation Age of Onset  . Diabetes Mother   . Diabetes Father  Social History:  reports that he has never smoked. He has never used smokeless tobacco. He reports that he does not drink alcohol or use drugs.   Physical Exam: BP (!) 148/101   Pulse (!) 123   Ht 5' 10"  (1.778 m)   Wt 220 lb (99.8 kg)   BMI 31.57 kg/m   Constitutional:  Alert and oriented, No acute distress. HEENT: [place] AT, moist mucus membranes.  Trachea midline, no masses. Cardiovascular: No clubbing, cyanosis, or edema. Respiratory: Normal  respiratory effort, no increased work of breathing. Skin: No rashes, bruises or suspicious lesions. Neurologic: Grossly intact, no focal deficits, moving all 4 extremities. Psychiatric: Normal mood and affect.   Pertinent Imaging: I have reviewed KUB. Large amounts of stool and bowel gas found obscuring the renal outlines. Faint calcifications overlying the right renal shadow were also seen.   Assessment & Plan:    1. Nephrolithiasis 1 yr f/u for KUB and monitoring of nephrolithiasis    [facility] [address], [place], [place] [address] [phone]  I, Noland Fordyce, am acting as a medical scribe for Dr. John Giovanni.  I have reviewed the above documentation for accuracy and completeness, and I agree with the above.   Abbie Sons, MD

## 2019-12-28 ENCOUNTER — Ambulatory Visit
Admission: RE | Admit: 2019-12-28 | Discharge: 2019-12-28 | Disposition: A | Discharge status: Home / Self Care | Payer: Managed Care, Other (non HMO) | Source: Ambulatory Visit | Attending: Urology | Admitting: Urology

## 2019-12-28 ENCOUNTER — Encounter: Payer: Self-pay | Admitting: Urology

## 2019-12-28 ENCOUNTER — Ambulatory Visit (INDEPENDENT_AMBULATORY_CARE_PROVIDER_SITE_OTHER): Payer: Managed Care, Other (non HMO) | Admitting: Urology

## 2019-12-28 ENCOUNTER — Other Ambulatory Visit: Payer: Self-pay

## 2019-12-28 VITALS — BP 148/101 | HR 123 | Ht 70.0 in | Wt 220.0 lb

## 2019-12-28 DIAGNOSIS — N200 Calculus of kidney: Secondary | ICD-10-CM | POA: Insufficient documentation

## 2020-01-27 ENCOUNTER — Ambulatory Visit: Payer: Managed Care, Other (non HMO) | Attending: Internal Medicine

## 2020-01-27 ENCOUNTER — Other Ambulatory Visit: Payer: Self-pay

## 2020-01-27 DIAGNOSIS — Z23 Encounter for immunization: Secondary | ICD-10-CM

## 2020-01-27 NOTE — Progress Notes (Signed)
   Covid-19 Vaccination Clinic  Name:  CUAUHTEMOC HUEGEL    MRN: [id number] DOB: 11-Mar-1956  01/27/2020  Mr. Holtsclaw was observed post Covid-19 immunization for 15 minutes without incident. He was provided with Vaccine Information Sheet and instruction to access the V-Safe system.   Mr. Gahm was instructed to call 911 with any severe reactions post vaccine: Marland Kitchen Difficulty breathing  . Swelling of face and throat  . A fast heartbeat  . A bad rash all over body  . Dizziness and weakness   Immunizations Administered    Name Date Dose VIS Date Route   Pfizer COVID-19 Vaccine 01/27/2020  8:16 AM 0.3 mL 11/29/2018 Intramuscular   Manufacturer: [employer], [employer]   Lot: [id number]   NDC: [address]-1

## 2020-02-20 ENCOUNTER — Ambulatory Visit: Payer: Managed Care, Other (non HMO) | Attending: Internal Medicine

## 2020-02-20 DIAGNOSIS — Z23 Encounter for immunization: Secondary | ICD-10-CM

## 2020-02-20 NOTE — Progress Notes (Signed)
   Covid-19 Vaccination Clinic  Name:  Brandon [name]    MRN: [id number] DOB: June 24, 1956  02/20/2020  Mr. Brandon [name] was observed post Covid-19 immunization for 15 minutes without incident. He was provided with Vaccine Information Sheet and instruction to access the V-Safe system.   Mr. Brandon [name] was instructed to call 911 with any severe reactions post vaccine: Marland Kitchen Difficulty breathing  . Swelling of face and throat  . A fast heartbeat  . A bad rash all over body  . Dizziness and weakness   Immunizations Administered    Name Date Dose VIS Date Route   Pfizer COVID-19 Vaccine 02/20/2020  3:02 PM 0.3 mL 11/29/2018 Intramuscular   Manufacturer: [employer], [employer]   Lot: [id number]   NDC: [address]-2

## 2020-12-27 ENCOUNTER — Ambulatory Visit: Payer: Self-pay | Admitting: Urology

## 2020-12-27 ENCOUNTER — Encounter: Payer: Self-pay | Admitting: Urology

## 2022-03-30 ENCOUNTER — Other Ambulatory Visit: Payer: Self-pay | Admitting: Family Medicine

## 2022-03-30 DIAGNOSIS — R1310 Dysphagia, unspecified: Secondary | ICD-10-CM

## 2022-07-15 ENCOUNTER — Emergency Department
Admission: EM | Admit: 2022-07-15 | Discharge: 2022-07-15 | Disposition: A | Discharge status: Home / Self Care | Payer: BC Managed Care – PPO | Attending: Emergency Medicine | Admitting: Emergency Medicine

## 2022-07-15 ENCOUNTER — Emergency Department: Payer: BC Managed Care – PPO

## 2022-07-15 ENCOUNTER — Other Ambulatory Visit: Payer: Self-pay

## 2022-07-15 DIAGNOSIS — E119 Type 2 diabetes mellitus without complications: Secondary | ICD-10-CM | POA: Diagnosis not present

## 2022-07-15 DIAGNOSIS — I10 Essential (primary) hypertension: Secondary | ICD-10-CM | POA: Insufficient documentation

## 2022-07-15 DIAGNOSIS — R109 Unspecified abdominal pain: Secondary | ICD-10-CM

## 2022-07-15 DIAGNOSIS — N132 Hydronephrosis with renal and ureteral calculous obstruction: Secondary | ICD-10-CM | POA: Diagnosis not present

## 2022-07-15 DIAGNOSIS — N200 Calculus of kidney: Secondary | ICD-10-CM

## 2022-07-15 LAB — COMPREHENSIVE METABOLIC PANEL
ALT: 25 U/L (ref 0–44)
AST: 34 U/L (ref 15–41)
Albumin: 4.6 g/dL (ref 3.5–5.0)
Alkaline Phosphatase: 37 U/L — ABNORMAL LOW (ref 38–126)
Anion gap: 8 (ref 5–15)
BUN: 22 mg/dL (ref 8–23)
CO2: 25 mmol/L (ref 22–32)
Calcium: 9 mg/dL (ref 8.9–10.3)
Chloride: 106 mmol/L (ref 98–111)
Creatinine, Ser: 0.93 mg/dL (ref 0.61–1.24)
GFR, Estimated: >60 mL/min (ref >60)
Glucose, Bld: 145 mg/dL — ABNORMAL HIGH (ref 70–99)
Potassium: 4.2 mmol/L (ref 3.5–5.1)
Sodium: 139 mmol/L (ref 135–145)
Total Bilirubin: 1.6 mg/dL — ABNORMAL HIGH (ref 0.3–1.2)
Total Protein: 7.4 g/dL (ref 6.5–8.1)

## 2022-07-15 LAB — CBC WITH DIFFERENTIAL/PLATELET
Abs Immature Granulocytes: 0.02 10*3/uL (ref 0.00–0.07)
Basophils Absolute: 0 10*3/uL (ref 0.0–0.1)
Basophils Relative: 0 %
Eosinophils Absolute: 0 10*3/uL (ref 0.0–0.5)
Eosinophils Relative: 1 %
HCT: 41.4 % (ref 39.0–52.0)
Hemoglobin: 14.3 g/dL (ref 13.0–17.0)
Immature Granulocytes: 0 %
Lymphocytes Relative: 13 %
Lymphs Abs: 0.9 10*3/uL (ref 0.7–4.0)
MCH: 32 pg (ref 26.0–34.0)
MCHC: 34.5 g/dL (ref 30.0–36.0)
MCV: 92.6 fL (ref 80.0–100.0)
Monocytes Absolute: 0.5 10*3/uL (ref 0.1–1.0)
Monocytes Relative: 7 %
Neutro Abs: 5.7 10*3/uL (ref 1.7–7.7)
Neutrophils Relative %: 79 %
Platelets: 203 10*3/uL (ref 150–400)
RBC: 4.47 MIL/uL (ref 4.22–5.81)
RDW: 12.7 % (ref 11.5–15.5)
WBC: 7.2 10*3/uL (ref 4.0–10.5)
nRBC: 0 % (ref 0.0–0.2)

## 2022-07-15 LAB — URINALYSIS, ROUTINE W REFLEX MICROSCOPIC
Bacteria, UA: NONE SEEN
Bilirubin Urine: NEGATIVE
Glucose, UA: NEGATIVE mg/dL
Ketones, ur: NEGATIVE mg/dL
Leukocytes,Ua: NEGATIVE
Nitrite: NEGATIVE
Protein, ur: NEGATIVE mg/dL
RBC / HPF: >50 RBC/hpf — ABNORMAL HIGH (ref 0–5)
Specific Gravity, Urine: 1.019 (ref 1.005–1.030)
pH: 5 (ref 5.0–8.0)

## 2022-07-15 LAB — LIPASE, BLOOD: Lipase: 26 U/L (ref 11–51)

## 2022-07-15 MED ORDER — ONDANSETRON HCL 4 MG/2ML IJ SOLN: 4.0000 mg | Freq: Once | INTRAMUSCULAR | Status: DC

## 2022-07-15 MED ORDER — KETOROLAC TROMETHAMINE 10 MG PO TABS: 10.0000 mg | ORAL_TABLET | Freq: Four times a day (QID) | ORAL | 0 refills | Status: DC | PRN

## 2022-07-15 MED ORDER — ONDANSETRON 4 MG PO TBDP: 4.0000 mg | ORAL_TABLET | Freq: Three times a day (TID) | ORAL | 0 refills | Status: DC | PRN

## 2022-07-15 NOTE — ED Triage Notes (Signed)
Pt arrives with c/o right side flank pain that started earlier today. Pt endorses n/v. Pt has hx of kidney stones.

## 2022-07-15 NOTE — ED Provider Triage Note (Signed)
  Emergency Medicine Provider Triage Evaluation Note  Brandon [name] , a 66 y.o.male,  was evaluated in triage.  Pt complains of right-sided flank pain and nausea.  Patient states that he believes he is having another kidney stone.  He has had many in the past, some of them requiring lithotripsy.  Endorsing nausea at this time.   Review of Systems  Positive: Nausea, right-sided flank pain Negative: Denies fever, chest pain, vomiting  Physical Exam  There were no vitals filed for this visit. Gen:   Awake, no distress   Resp:  Normal effort  MSK:   Moves extremities without difficulty  Other:    Medical Decision Making  Given the patient's initial medical screening exam, the following diagnostic evaluation has been ordered. The patient will be placed in the appropriate treatment space, once one is available, to complete the evaluation and treatment. I have discussed the plan of care with the patient and I have advised the patient that an ED physician or mid-level practitioner will reevaluate their condition after the test results have been received, as the results may give them additional insight into the type of treatment they may need.    Diagnostics: Labs, UA, CT renal  Treatments: Ondansetron   Teodoro Spray, [place] 07/15/22 1707

## 2022-07-15 NOTE — ED Provider Notes (Signed)
[facility] Provider Note    Event Date/Time   First MD Initiated Contact with Patient 07/15/22 2058     (approximate)   History   Chief Complaint Flank Pain   HPI  Brandon [name] is a 66 y.o. male with past medical history of hypertension, diabetes, and kidney stones who presents to the ED complaining of flank pain.  Patient reports that he had sudden onset of severe pain in his right flank around 3 PM this afternoon.  When pain was severe he was feeling very nauseous and eventually vomited once.  He states his pain is now improved, currently described as mild with no ongoing nausea.  He has not had any fevers, abdominal pain, dysuria, hematuria, or testicular pain.  He describes symptoms as similar to when he has dealt with a kidney stone in the past.     Physical Exam   Triage Vital Signs: ED Triage Vitals [07/15/22 1803]  Enc Vitals Group     BP 121/84     Pulse Rate 71     Resp 16     Temp 98.2 F (36.8 C)     Temp Source Oral     SpO2 95 %     Weight 219 lb (99.3 kg)     Height 5\' 11"  (1.803 m)     Head Circumference      Peak Flow      Pain Score      Pain Loc      Pain Edu?      Excl. in [place]?     Most recent vital signs: Vitals:   07/15/22 1803  BP: 121/84  Pulse: 71  Resp: 16  Temp: 98.2 F (36.8 C)  SpO2: 95%    Constitutional: Alert and oriented. Eyes: Conjunctivae are normal. Head: Atraumatic. Nose: No congestion/rhinnorhea. Mouth/Throat: Mucous membranes are moist.  Cardiovascular: Normal rate, regular rhythm. Grossly normal heart sounds.  2+ radial pulses bilaterally. Respiratory: Normal respiratory effort.  No retractions. Lungs CTAB. Gastrointestinal: Soft and nontender.  No CVA tenderness bilaterally.  No distention. Musculoskeletal: No lower extremity tenderness nor edema.  Neurologic:  Normal speech and language. No gross focal neurologic deficits are appreciated.    ED Results / Procedures / Treatments    Labs (all labs ordered are listed, but only abnormal results are displayed) Labs Reviewed  URINALYSIS, ROUTINE W REFLEX MICROSCOPIC - Abnormal; Notable for the following components:      Result Value   Color, Urine YELLOW (*)    APPearance HAZY (*)    Hgb urine dipstick LARGE (*)    RBC / HPF >50 (*)    All other components within normal limits  COMPREHENSIVE METABOLIC PANEL - Abnormal; Notable for the following components:   Glucose, Bld 145 (*)    Alkaline Phosphatase 37 (*)    Total Bilirubin 1.6 (*)    All other components within normal limits  CBC WITH DIFFERENTIAL/PLATELET  LIPASE, BLOOD    RADIOLOGY CT renal protocol reviewed and interpreted by me with obstructing stone at the right mid ureter.  PROCEDURES:  Critical Care performed: No  Procedures   MEDICATIONS ORDERED IN ED: Medications  ondansetron (ZOFRAN) injection 4 mg (has no administration in time range)     IMPRESSION / MDM / ASSESSMENT AND PLAN / ED COURSE  I reviewed the triage vital signs and the nursing notes.  66 y.o. male with past medical history of hypertension, diabetes, and kidney stones who presents to the ED with sudden onset flank pain, nausea, and vomiting around 3 PM this afternoon.  Patient's presentation is most consistent with acute presentation with potential threat to life or bodily function.  Differential diagnosis includes, but is not limited to, kidney stone, pyelonephritis, AKI, electrolyte abnormality, cholecystitis, biliary colic.  Patient nontoxic-appearing and in no acute distress, vital signs are unremarkable and he has a benign abdominal exam with no CVA tenderness.  He states his pain is much improved, CT renal protocol does show obstructing stone at the right mid ureter, approximately 7 mm in size with associated hydronephrosis.  Urinalysis shows no signs of infection and labs are reassuring with no significant anemia, leukocytosis,  electrolyte abnormality, or AKI.  Patient declines additional pain medication at this time and he is appropriate for discharge home with outpatient urology follow-up.  He will be prescribed Toradol as he is not interested in narcotic pain medication due to issues with constipation.  He was counseled to return to the ED for new or worsening symptoms, patient agrees with plan.      FINAL CLINICAL IMPRESSION(S) / ED DIAGNOSES   Final diagnoses:  Right flank pain  Kidney stone     Rx / DC Orders   ED Discharge Orders          Ordered    ondansetron (ZOFRAN-ODT) 4 MG disintegrating tablet  Every 8 hours PRN        07/15/22 2125    ketorolac (TORADOL) 10 MG tablet  Every 6 hours PRN        07/15/22 2125             Note:  This document was prepared using Dragon voice recognition software and may include unintentional dictation errors.   Blake Divine, MD 07/15/22 2129

## 2022-07-16 ENCOUNTER — Ambulatory Visit: Payer: BC Managed Care – PPO | Admitting: Urology

## 2022-07-16 ENCOUNTER — Encounter: Payer: Self-pay | Admitting: Urology

## 2022-07-16 VITALS — BP 115/78 | HR 71 | Ht 70.0 in | Wt 219.0 lb

## 2022-07-16 DIAGNOSIS — N201 Calculus of ureter: Secondary | ICD-10-CM | POA: Diagnosis not present

## 2022-07-16 DIAGNOSIS — N200 Calculus of kidney: Secondary | ICD-10-CM

## 2022-07-16 LAB — MICROSCOPIC EXAMINATION: RBC, Urine: >30 /hpf — AB (ref 0–2)

## 2022-07-16 LAB — URINALYSIS, COMPLETE
Bilirubin, UA: NEGATIVE
Ketones, UA: NEGATIVE
Leukocytes,UA: NEGATIVE
Nitrite, UA: NEGATIVE
Specific Gravity, UA: 1.025 (ref 1.005–1.030)
Urobilinogen, Ur: 1 mg/dL (ref 0.2–1.0)
pH, UA: 5.5 (ref 5.0–7.5)

## 2022-07-16 MED ORDER — TAMSULOSIN HCL 0.4 MG PO CAPS: 0.4000 mg | ORAL_CAPSULE | Freq: Every day | ORAL | 0 refills | Status: DC

## 2022-07-16 NOTE — Progress Notes (Signed)
   07/16/2022 9:59 AM   Brandon [name] May 23, 1956 [id number]  Reason for visit: Right ureteral stone  HPI: 66 year old male previously followed by Dr Bernardo Heater for nephrolithiasis, he apparently requested to change providers.  He was seen in the ER last night with right-sided flank pain, CT showed a 6 mm right mid ureteral stone with hydronephrosis, urinalysis was benign aside from microscopic hematuria, and he was discharged with urology follow-up and pain medications.  I personally viewed and interpreted that CT, stone not clearly visible on scout CT, small nonobstructive renal stones in addition to the right ureteral stone.  He opted to defer narcotics, as he has not tolerated these well in the past.  He denies any fever, chills, or urinary symptoms.  Reports some dull right back discomfort today, but significantly improved from yesterday.  His history is notable for multiple calcium oxalate stones in the past, previously required shockwave lithotripsy.  He underwent a left-sided SWL for a proximal ureteral stone in August 2020 with hospitalization for renal colic secondary to stone fragments that required stent placement and ultimately follow-up ureteroscopy with Dr Bernardo Heater.  He did not tolerate the ureteral stent particularly well.  We discussed various treatment options for urolithiasis including observation with or without medical expulsive therapy, shockwave lithotripsy (SWL), ureteroscopy and laser lithotripsy with stent placement, and percutaneous nephrolithotomy.We discussed that management is based on stone size, location, density, patient co-morbidities, and patient preference. Stones <89mm in size have a >80% spontaneous passage rate. Data surrounding the use of tamsulosin for medical expulsive therapy is controversial, but meta analyses suggests it is most efficacious for distal stones between 5-83mm in size. Possible side effects include dizziness/lightheadedness, and retrograde  ejaculation.SWL has a lower stone free rate in a single procedure, but also a lower complication rate compared to ureteroscopy and avoids a stent and associated stent related symptoms. Possible complications include renal hematoma, steinstrasse, and need for additional treatment.Ureteroscopy with laser lithotripsy and stent placement has a higher stone free rate than SWL in a single procedure, however increased complication rate including possible infection, ureteral injury, bleeding, and stent related morbidity. Common stent related symptoms include dysuria, urgency/frequency, and flank pain.  After an extensive discussion of the risks and benefits of the above treatment options, the patient would like to proceed with at least 1 week of medical expulsive therapy with Flomax, but interested in right ureteroscopy/laser/stent end of next week if persistent symptoms. RTC Wednesday 10/18 symptom check, consider ureteroscopy 10/20 if needed   Billey Co, MD  [facility] [address] [place], [place] [address] [phone]

## 2022-07-16 NOTE — Patient Instructions (Signed)
Kidney Stones  Kidney stones are solid, rock-like deposits that form inside of the kidneys. The kidneys are a pair of organs that make urine. A kidney stone may form in a kidney and move into other parts of the urinary tract, including the tubes that connect the kidneys to the bladder (ureters), the bladder, and the tube that carries urine out of the body (urethra). As the stone moves through these areas, it can cause intense pain and block the flow of urine. Kidney stones are created when high levels of certain minerals are found in the urine. The stones are usually passed out of the body through urination, but in some cases, medical treatment may be needed to remove them. What are the causes? Kidney stones may be caused by: A condition in which certain glands produce too much parathyroid hormone (primary hyperparathyroidism), which causes too much calcium buildup in the blood. A buildup of uric acid crystals in the bladder (hyperuricosuria). Uric acid is a chemical that the body produces when you eat certain foods. It usually leaves the body in the urine. Narrowing (stricture) of one or both of the ureters. A kidney blockage that is present at birth (congenital obstruction). Past surgery on the kidney or the ureters. What increases the risk? The following factors may make you more likely to develop this condition: Having had a kidney stone in the past. Having a family history of kidney stones. Not drinking enough water. Eating a diet that is high in protein, salt (sodium), or sugar. Being overweight or obese. What are the signs or symptoms? Symptoms of a kidney stone may include: Pain in the side of the abdomen, right below the ribs (flank pain). Pain usually spreads (radiates) to the groin. Needing to urinate often or urgently. Painful urination. Blood in the urine (hematuria). Nausea. Vomiting. Fever and chills. How is this diagnosed? This condition may be diagnosed based on: Your  symptoms and medical history. A physical exam. Blood tests. Urine tests. These may be done before and after the stone passes out of your body through urination. Imaging tests, such as a CT scan, abdominal X-ray, or ultrasound. A procedure to examine the inside of the bladder (cystoscopy). How is this treated? Treatment for kidney stones depends on the size, location, and makeup of the stones. Kidney stones will often pass out of the body through urination. You may need to: Increase your fluid intake to help pass the stone. In some cases, you may be given fluids through an IV and may need to be monitored in the hospital. Take medicine for pain. Make changes in your diet to help prevent kidney stones from coming back. Sometimes, procedures are needed to remove a kidney stone. This may involve: A procedure to break up kidney stones using: A focused beam of light (laser therapy). Shock waves (extracorporeal shock wave lithotripsy). Surgery to remove kidney stones. This may be needed if you have severe pain or have stones that block your urinary tract. Follow these instructions at home: Medicines Take over-the-counter and prescription medicines only as told by your health care provider. Ask your health care provider if the medicine prescribed to you requires you to avoid driving or using heavy machinery. Eating and drinking Drink enough fluid to keep your urine pale yellow. You may be instructed to drink at least 8-10 glasses of water each day. This will help you pass the kidney stone. If directed, change your diet. This may include: Limiting how much sodium you eat. Eating more fruits   and vegetables. Limiting how much animal protein you eat. Animal proteins include red meat, poultry, fish, and eggs. Eating a normal amount of calcium (1,000-1,300 mg per day). Follow instructions from your health care provider about eating or drinking restrictions. General instructions Collect urine samples as  told by your health care provider. You may need to collect a urine sample: 24 hours after you pass the stone. 8-12 weeks after you pass the kidney stone, and every 6-12 months after that. Strain your urine every time you urinate, for as long as directed. Use the strainer that your health care provider recommends. Do not throw out the kidney stone after passing it. Keep the stone so it can be tested by your health care provider. Testing the makeup of your kidney stone may help prevent you from getting kidney stones in the future. Keep all follow-up visits. You may need follow-up X-rays or ultrasounds to make sure that your stone has passed. How is this prevented? To prevent another kidney stone: Drink enough fluid to keep your urine pale yellow. This is the best way to prevent kidney stones. Eat a healthy diet. Follow recommendations from your health care provider about foods to avoid. Recommendations vary depending on the type of kidney stone that you have. You may be instructed to eat a low-protein diet. Maintain a healthy weight. Where to find more information National Kidney Foundation (NKF): www.kidney.org [facility] (UCF): www.urologyhealth.org Contact a health care provider if: You have pain that gets worse or does not get better with medicine. Get help right away if: You have a fever or chills. You develop severe pain. You develop new abdominal pain. You faint. You are unable to urinate. Summary Kidney stones are solid, rock-like deposits that form inside of the kidneys. Kidney stones can cause nausea, vomiting, blood in the urine, abdominal pain, and the urge to urinate often. Treatment for kidney stones depends on the size, location, and makeup of the stones. Kidney stones will often pass out of the body through urination. Kidney stones can be prevented by drinking enough fluids, eating a healthy diet, and maintaining a healthy weight. This information is not intended  to replace advice given to you by your health care provider. Make sure you discuss any questions you have with your health care provider. Document Revised: 12/31/2021 Document Reviewed: 12/31/2021 Elsevier Patient Education  2023 Elsevier Inc.  Dietary Guidelines to Help Prevent Kidney Stones Kidney stones are deposits of minerals and salts that form inside your kidneys. Your risk of developing kidney stones may be greater depending on your diet, your lifestyle, the medicines you take, and whether you have certain medical conditions. Most people can lower their risks of developing kidney stones by following these dietary guidelines. Your dietitian may give you more specific instructions depending on your overall health and the type of kidney stones you tend to develop. What are tips for following this plan? Reading food labels  Choose foods with "no salt added" or "low-salt" labels. Limit your salt (sodium) intake to less than 1,500 mg a day. Choose foods with calcium for each meal and snack. Try to eat about 300 mg of calcium at each meal. Foods that contain 200-500 mg of calcium a serving include: 8 oz (237 mL) of milk, calcium-fortifiednon-dairy milk, and calcium-fortifiedfruit juice. Calcium-fortified means that calcium has been added to these drinks. 8 oz (237 mL) of kefir, yogurt, and soy yogurt. 4 oz (114 g) of tofu. 1 oz (28 g) of cheese. 1 cup (  150 g) of dried figs. 1 cup (91 g) of cooked broccoli. One 3 oz (85 g) can of sardines or mackerel. Most people need 1,000-1,500 mg of calcium a day. Talk to your dietitian about how much calcium is recommended for you. Shopping Buy plenty of fresh fruits and vegetables. Most people do not need to avoid fruits and vegetables, even if these foods contain nutrients that may contribute to kidney stones. When shopping for convenience foods, choose: Whole pieces of fruit. Pre-made salads with dressing on the side. Low-fat fruit and yogurt  smoothies. Avoid buying frozen meals or prepared deli foods. These can be high in sodium. Look for foods with live cultures, such as yogurt and kefir. Choose high-fiber grains, such as whole-wheat breads, oat bran, and wheat cereals. Cooking Do not add salt to food when cooking. Place a salt shaker on the table and allow each person to add their own salt to taste. Use vegetable protein, such as beans, textured vegetable protein (TVP), or tofu, instead of meat in pasta, casseroles, and soups. Meal planning Eat less salt, if told by your dietitian. To do this: Avoid eating processed or pre-made food. Avoid eating fast food. Eat less animal protein, including cheese, meat, poultry, or fish, if told by your dietitian. To do this: Limit the number of times you have meat, poultry, fish, or cheese each week. Eat a diet free of meat at least 2 days a week. Eat only one serving each day of meat, poultry, fish, or seafood. When you prepare animal proteins, cut pieces into small portion sizes. For most meat and fish, one serving is about the size of the palm of your hand. Eat at least five servings of fresh fruits and vegetables each day. To do this: Keep fruits and vegetables on hand for snacks. Eat one piece of fruit or a handful of berries with breakfast. Have a salad and fruit at lunch. Have two kinds of vegetables at dinner. You may be told to limit foods that are high in a substance called oxalate. These include: Spinach (cooked), rhubarb, beets, sweet potatoes, and Swiss chard. Peanuts. Potato chips, french fries, and baked potatoes with skin on. Nuts and nut products. Chocolate. If you regularly take a diuretic medicine, make sure to eat at least 1 or 2 servings of fruits or vegetables that are high in potassium each day. These include: Avocado. Banana. Orange, prune, carrot, or tomato juice. Baked potato. Cabbage. Beans and split peas. Lifestyle  Drink enough fluid to keep your urine  pale yellow. This is the most important thing you can do. Spread your fluid intake throughout the day. If you drink alcohol: Limit how much you have to: 0-1 drink a day for women who are not pregnant. 0-2 drinks a day for men. Know how much alcohol is in your drink. In the [place] one drink equals one 12 oz bottle of beer (355 mL), one 5 oz glass of wine (148 mL), or one 1 oz glass of hard liquor (44 mL). Lose weight if told by your health care provider. Work with your dietitian to find an eating plan and weight loss strategies that work best for you. General information Talk to your health care provider and dietitian about taking daily supplements. Depending on your health and the cause of your kidney stones, you may be told: Do not take high-dose supplements of vitamin C (1,000 mg a day or more). To take a calcium supplement. To take a daily probiotic supplement. To take   other supplements such as magnesium, fish oil, or vitamin B6. Take over-the-counter and prescription medicines only as told by your health care provider. These include supplements. What foods should I limit? Limit your intake of the following foods, or eat them as told by your dietitian. Vegetables Spinach. Rhubarb. Beets. Canned vegetables. Pickles. Olives. Baked potatoes with skin. Grains Wheat bran. Baked goods. Salted crackers. Cereals high in sugar. Meats and other proteins Nuts. Nut butters. Large portions of meat, poultry, or fish. Salted, precooked, or cured meats, such as sausages, meat loaves, and hot dogs. Dairy Cheeses. Beverages Regular soft drinks. Regular vegetable juice. Seasonings and condiments Seasoning blends with salt. Salad dressings. Soy sauce. Ketchup. Barbecue sauce. Other foods Canned soups. Canned pasta sauce. Casseroles. Pizza. Lasagna. Frozen meals. Potato chips. French fries. The items listed above may not be a complete list of foods and beverages you should limit. Contact a dietitian for  more information. What foods should I avoid? Talk to your dietitian about specific foods you should avoid based on the type of kidney stones you have and your overall health. Fruits Grapefruit. The item listed above may not be a complete list of foods and beverages you should avoid. Contact a dietitian for more information. Summary Kidney stones are deposits of minerals and salts that form inside your kidneys. You can lower your risk of kidney stones by making changes to your diet. The most important thing you can do is drink enough fluid. Drink enough fluid to keep your urine pale yellow. Talk to your dietitian about how much calcium you should have each day, and eat less salt and animal protein as told by your dietitian. This information is not intended to replace advice given to you by your health care provider. Make sure you discuss any questions you have with your health care provider. Document Revised: 01/01/2022 Document Reviewed: 01/01/2022 Elsevier Patient Education  2023 Elsevier Inc.  

## 2022-07-16 NOTE — Addendum Note (Signed)
Addended by: Despina Hidden on: 07/16/2022 10:45 AM   Modules accepted: Orders

## 2022-07-22 ENCOUNTER — Ambulatory Visit: Payer: BC Managed Care – PPO | Admitting: Urology

## 2022-12-25 ENCOUNTER — Emergency Department (HOSPITAL_COMMUNITY)
Admission: EM | Admit: 2022-12-25 | Discharge: 2023-01-04 | Disposition: E | Payer: BC Managed Care – PPO | Attending: Emergency Medicine | Admitting: Emergency Medicine

## 2022-12-25 DIAGNOSIS — E119 Type 2 diabetes mellitus without complications: Secondary | ICD-10-CM | POA: Insufficient documentation

## 2022-12-25 DIAGNOSIS — Z7984 Long term (current) use of oral hypoglycemic drugs: Secondary | ICD-10-CM | POA: Insufficient documentation

## 2022-12-25 DIAGNOSIS — I469 Cardiac arrest, cause unspecified: Secondary | ICD-10-CM | POA: Diagnosis present

## 2022-12-25 DIAGNOSIS — R791 Abnormal coagulation profile: Secondary | ICD-10-CM | POA: Insufficient documentation

## 2022-12-25 DIAGNOSIS — I4901 Ventricular fibrillation: Secondary | ICD-10-CM | POA: Diagnosis not present

## 2022-12-25 DIAGNOSIS — Z794 Long term (current) use of insulin: Secondary | ICD-10-CM | POA: Insufficient documentation

## 2022-12-25 DIAGNOSIS — Z7982 Long term (current) use of aspirin: Secondary | ICD-10-CM | POA: Diagnosis not present

## 2022-12-25 DIAGNOSIS — Z79899 Other long term (current) drug therapy: Secondary | ICD-10-CM | POA: Insufficient documentation

## 2022-12-25 DIAGNOSIS — I10 Essential (primary) hypertension: Secondary | ICD-10-CM | POA: Diagnosis not present

## 2022-12-25 LAB — COMPREHENSIVE METABOLIC PANEL
ALT: 189 U/L — ABNORMAL HIGH (ref 0–44)
AST: 170 U/L — ABNORMAL HIGH (ref 15–41)
Albumin: 2.7 g/dL — ABNORMAL LOW (ref 3.5–5.0)
Alkaline Phosphatase: 45 U/L (ref 38–126)
Anion gap: 19 — ABNORMAL HIGH (ref 5–15)
BUN: 18 mg/dL (ref 8–23)
CO2: 16 mmol/L — ABNORMAL LOW (ref 22–32)
Calcium: 8.4 mg/dL — ABNORMAL LOW (ref 8.9–10.3)
Chloride: 104 mmol/L (ref 98–111)
Creatinine, Ser: 1.3 mg/dL — ABNORMAL HIGH (ref 0.61–1.24)
GFR, Estimated: 60 mL/min — ABNORMAL LOW (ref >60)
Glucose, Bld: 256 mg/dL — ABNORMAL HIGH (ref 70–99)
Potassium: 5.1 mmol/L (ref 3.5–5.1)
Sodium: 139 mmol/L (ref 135–145)
Total Bilirubin: 1.1 mg/dL (ref 0.3–1.2)
Total Protein: 4.6 g/dL — ABNORMAL LOW (ref 6.5–8.1)

## 2022-12-25 LAB — CBC
HCT: 42.8 % (ref 39.0–52.0)
Hemoglobin: 13.2 g/dL (ref 13.0–17.0)
MCH: 32.5 pg (ref 26.0–34.0)
MCHC: 30.8 g/dL (ref 30.0–36.0)
MCV: 105.4 fL — ABNORMAL HIGH (ref 80.0–100.0)
Platelets: 109 10*3/uL — ABNORMAL LOW (ref 150–400)
RBC: 4.06 MIL/uL — ABNORMAL LOW (ref 4.22–5.81)
RDW: 13 % (ref 11.5–15.5)
WBC: 6.7 10*3/uL (ref 4.0–10.5)
nRBC: 0.4 % — ABNORMAL HIGH (ref 0.0–0.2)

## 2022-12-25 LAB — I-STAT CHEM 8, ED
BUN: 24 mg/dL — ABNORMAL HIGH (ref 8–23)
Calcium, Ion: 1.06 mmol/L — ABNORMAL LOW (ref 1.15–1.40)
Chloride: 104 mmol/L (ref 98–111)
Creatinine, Ser: 1.3 mg/dL — ABNORMAL HIGH (ref 0.61–1.24)
Glucose, Bld: 273 mg/dL — ABNORMAL HIGH (ref 70–99)
HCT: 38 % — ABNORMAL LOW (ref 39.0–52.0)
Hemoglobin: 12.9 g/dL — ABNORMAL LOW (ref 13.0–17.0)
Potassium: 4.8 mmol/L (ref 3.5–5.1)
Sodium: 137 mmol/L (ref 135–145)
TCO2: 20 mmol/L — ABNORMAL LOW (ref 22–32)

## 2022-12-25 LAB — PROCALCITONIN: Procalcitonin: <0.1 ng/mL

## 2022-12-25 LAB — APTT: aPTT: 47 seconds — ABNORMAL HIGH (ref 24–36)

## 2022-12-25 LAB — PROTIME-INR
INR: 1.5 — ABNORMAL HIGH (ref 0.8–1.2)
Prothrombin Time: 18.1 seconds — ABNORMAL HIGH (ref 11.4–15.2)

## 2022-12-25 LAB — MAGNESIUM: Magnesium: 2.5 mg/dL — ABNORMAL HIGH (ref 1.7–2.4)

## 2022-12-25 LAB — TROPONIN I (HIGH SENSITIVITY): Troponin I (High Sensitivity): 543 ng/L (ref <18)

## 2022-12-25 LAB — LACTIC ACID, PLASMA: Lactic Acid, Venous: >9 mmol/L (ref 0.5–1.9)

## 2022-12-25 LAB — PHOSPHORUS: Phosphorus: 9.9 mg/dL — ABNORMAL HIGH (ref 2.5–4.6)

## 2023-01-04 NOTE — Code Documentation (Signed)
Patient time of death occurred at 0340 03-Jan-2023.

## 2023-01-04 NOTE — ED Triage Notes (Signed)
67 year old male with hx of Diabetes and HTN arrived by ems for cardiac arrest. Family reports last seen normal at midnight. Pt started having abnormal breathing, family called 911 and started CPR. About 5-10 minutes of pt downtime. Fire arrived with pt in Vfib. Shocked 4 times. EMS in total gave 450mg  of Amiodarone, 4g Mag, 18 epis, and 1 dose of Lidocaine. EMS reports spontaneous breathing with pt biting the ET tube, ETCO2 in the 50s. IO to left tib. Upon arrival to ER, CPR in progress via [place] device. MD at bedside.  0330 5mg  Lidocaine given through IO 0330 ETT placement by [place] MD 0331 Bicarb given through IO  0333 Dual defibrillation given by two sets of pads set at [place] 4mg  Lidocaine given  0337 Pulse check 0338 No pulse, compressions resumed 0340 TOD called by Leonette Monarch MD

## 2023-01-04 NOTE — ED Provider Notes (Addendum)
[facility] Provider Note  CSN: [id number] Arrival date & time: January 14, 2023 [id number]  Chief Complaint(s) No chief complaint on file.  HPI Brandon [name] is a 67 y.o. male who presents by EMS after witnessed cardiac arrest.  Minimal downtime before bystander CPR was initiated.  Initial rhythm was noted to be V-fib.  Patient received numerous defibrillation shocks, numerous rounds of epi, maxed out on amiodarone and magnesium.  Patient also given lidocaine prior to arrival.  LMA had been inserted.  Upon arrival patient was still in V-fib.   HPI  Past Medical History Past Medical History:  Diagnosis Date   BPH (benign prostatic hyperplasia)    Diabetes mellitus without complication (HCC)    type 2   GERD (gastroesophageal reflux disease)    History of kidney stones    required lithotripsy around 2010, sees Dr. Bernardo Heater   Hypertension    Patient Active Problem List   Diagnosis Date Noted   Ureteral calculi 05/21/2019   Ureteral calculus, left 05/16/2019   Hydronephrosis with urinary obstruction due to ureteral calculus 05/16/2019   Nephrolithiasis 05/16/2019   Valvular insufficiency 05/15/2019   Hypoglycemia due to insulin 09/20/2018   Diabetes ([place]) 09/20/2018   HTN (hypertension) 09/20/2018   GERD (gastroesophageal reflux disease) 09/20/2018   Obesity (BMI 30.0-34.9) 04/12/2017   Vaccine counseling 02/11/2016   Pure hypercholesterolemia 04/05/2015   Home Medication(s) Prior to Admission medications   Medication Sig Start Date End Date Taking? Authorizing Provider  alendronate (FOSAMAX) 70 MG tablet Take by mouth. 12/14/19   [provider]  Ascorbic Acid (VITAMIN C) 1000 MG tablet Take 1,000 mg by mouth daily.    [provider]  aspirin EC 81 MG tablet Take 81 mg by mouth daily.    [provider]  ezetimibe (ZETIA) 10 MG tablet Take 10 mg by mouth daily. 06/29/22   [provider]  glucose blood  (ONETOUCH ULTRA) test strip Use 4 (four) times daily 04/20/19   [provider]  Insulin Glargine (LANTUS SOLOSTAR) 100 UNIT/ML Solostar Pen Inject 50 Units into the skin at bedtime. 06/14/18   [provider]  Insulin Pen Needle (B-D ULTRAFINE III SHORT PEN) 31G X 8 MM MISC as directed Use four times daily. Dx: E11.69, E78.5 04/16/21   [provider]  ketorolac (TORADOL) 10 MG tablet Take 1 tablet (10 mg total) by mouth every 6 (six) hours as needed. 07/15/22   Blake Divine, MD  lisinopril (PRINIVIL,ZESTRIL) 5 MG tablet Take 5 mg by mouth daily. 06/17/18   [provider]  metFORMIN (GLUCOPHAGE) 500 MG tablet Take 500 mg by mouth 2 (two) times daily. 05/12/18   [provider]  Multiple Vitamin (MULTI-VITAMINS) TABS Take 1 tablet by mouth daily.    [provider]  NOVOLOG FLEXPEN 100 UNIT/ML FlexPen SMARTSIG:8-10 Unit(s) SUB-Q 3 Times Daily 07/10/22   [provider]  omeprazole (PRILOSEC OTC) 20 MG tablet Take 20 mg by mouth daily.     [provider]  ondansetron (ZOFRAN-ODT) 4 MG disintegrating tablet Take 1 tablet (4 mg total) by mouth every 8 (eight) hours as needed for nausea or vomiting. 07/15/22   Blake Divine, MD  SEMGLEE, YFGN, 100 UNIT/ML Pen SMARTSIG:32 Unit(s) SUB-Q Twice Daily 07/10/22   [provider]  tamsulosin (FLOMAX) 0.4 MG CAPS capsule Take 1 capsule (0.4 mg total) by mouth daily after supper. 07/16/22   Billey Co, MD  Allergies Pravastatin  Review of Systems Review of Systems As noted in HPI  Physical Exam Vital Signs  I have reviewed the triage vital signs BP (!) 146/89 (BP Location: Left Arm)   Pulse (!) 120   Temp (!) 84.1 F (28.9 C) (Temporal)   Resp 0  Physical Exam Constitutional:      General: He is in acute distress.     Appearance: He is  toxic-appearing.  HENT:     Head: Normocephalic and atraumatic.     Right Ear: External ear normal.     Left Ear: External ear normal.     Nose: Nose normal.     Mouth/Throat:     Mouth: Mucous membranes are moist.  Eyes:     General: No scleral icterus.    Comments: Pupils fixed and dilated  Neck:     Trachea: Phonation normal.  Cardiovascular:     Comments: pulseless Pulmonary:     Comments: Apneic LMA in place Abdominal:     General: There is no distension.  Musculoskeletal:        General: Normal range of motion.     ED Results and Treatments Labs (all labs ordered are listed, but only abnormal results are displayed) Labs Reviewed  CBC - Abnormal; Notable for the following components:      Result Value   RBC 4.06 (*)    MCV 105.4 (*)    Platelets 109 (*)    nRBC 0.4 (*)    All other components within normal limits  APTT - Abnormal; Notable for the following components:   aPTT 47 (*)    All other components within normal limits  PROTIME-INR - Abnormal; Notable for the following components:   Prothrombin Time 18.1 (*)    INR 1.5 (*)    All other components within normal limits  I-STAT CHEM 8, ED - Abnormal; Notable for the following components:   BUN 24 (*)    Creatinine, Ser 1.30 (*)    Glucose, Bld 273 (*)    Calcium, Ion 1.06 (*)    TCO2 20 (*)    Hemoglobin 12.9 (*)    HCT 38.0 (*)    All other components within normal limits  COMPREHENSIVE METABOLIC PANEL  LACTIC ACID, PLASMA  LACTIC ACID, PLASMA  LACTIC ACID, PLASMA  LACTIC ACID, PLASMA  MAGNESIUM  PHOSPHORUS  PROCALCITONIN  CBG MONITORING, ED  TYPE AND SCREEN  TROPONIN I (HIGH SENSITIVITY)                                                                                                                         EKG  EKG Interpretation  Date/Time:    Ventricular Rate:    PR Interval:    QRS Duration:   QT Interval:    QTC Calculation:   R Axis:     Text Interpretation:          Radiology No results found.  Medications Ordered in ED Medications -  No data to display                                                                                                                                   Procedures Procedure Name: Intubation Date/Time: 01-20-23 4:04 AM  Performed by: Fatima Blank, MDPre-anesthesia Checklist: Patient identified, Patient being monitored, Emergency Drugs available, Timeout performed and Suction available Oxygen Delivery Method: Ambu bag Ventilation: Mask ventilation without difficulty Laryngoscope Size: Mac and 4 Grade View: Grade III Tube size: 8.0 mm Number of attempts: 1 Placement Confirmation: ETT inserted through vocal cords under direct vision, CO2 detector and Breath sounds checked- equal and bilateral Secured at: 27 cm Tube secured with: ETT holder    CPR  Date/Time: Jan 20, 2023 4:05 AM  Performed by: Fatima Blank, MD Authorized by: Fatima Blank, MD  CPR Procedure Details:      Amount of time prior to administration of ACLS/BLS (minutes):  10   ACLS/BLS initiated by EMS: Yes     CPR/ACLS performed in the ED: Yes     Duration of CPR (minutes):  100   Outcome: Pt declared dead    CPR performed via ACLS guidelines under my direct supervision.  See RN documentation for details including defibrillator use, medications, doses and timing. .Critical Care  Performed by: Fatima Blank, MD Authorized by: Fatima Blank, MD   Critical care provider statement:    Critical care time (minutes):  30   Critical care was necessary to treat or prevent imminent or life-threatening deterioration of the following conditions:  Cardiac failure   Critical care was time spent personally by me on the following activities:  Development of treatment plan with patient or surrogate, discussions with consultants, evaluation of patient's response to treatment, examination of patient, ordering and review of  laboratory studies, ordering and review of radiographic studies, ordering and performing treatments and interventions, pulse oximetry, re-evaluation of patient's condition and review of old charts   (including critical care time)  Medical Decision Making / ED Course  Click here for ABCD2, HEART and other calculators  Medical Decision Making Amount and/or Complexity of Data Reviewed Labs: ordered.    ET tube was inserted to secure airway He was given 2 rounds of lidocaine A dose of bicarb Sequential defibrillation performed x 2  After 15 minutes of CPR/ACLS, patient was noted to be in asystole. Initial downtime was almost 2 hours prior to arrival.  Patient was pronounced dead at 3:40 AM Family updated   Final Clinical Impression(s) / ED Diagnoses Final diagnoses:  Cardiac arrest with ventricular fibrillation [facility])           This chart was dictated using voice recognition software.  Despite best efforts to proofread,  errors can occur which can change the documentation meaning.   Fatima Blank, MD 20-Jan-2023 [phone]

## 2023-01-04 NOTE — Progress Notes (Signed)
   01/16/23 0340  Spiritual Encounters  Type of Visit Initial  Care provided to: Family  Referral source Physician  Reason for visit Patient death  OnCall Visit Yes  Spiritual Framework  Presenting Themes Values and beliefs;Impactful experiences and emotions  Interventions  Spiritual Care Interventions Made Established relationship of care and support;Compassionate presence;Reflective listening;Normalization of emotions;Bereavement/grief support;Prayer;Supported grief process  Intervention Outcomes  Outcomes Connection to spiritual care;Awareness of support   Chaplain provided grief support to the family. Chaplain prayed with family and escorted them to see patient's body. Family is experiencing normal grief.   Note prepared by Abbott Pao, [place] Resident [phone]

## 2023-01-04 NOTE — Code Documentation (Signed)
Patient not [place] case.

## 2023-01-04 DEATH — deceased
